# Patient Record
Sex: Female | Born: 1940
Health system: Southern US, Community
[De-identification: ages and names within clinical notes are randomized; demographics above are authoritative.]

## PROBLEM LIST (undated history)

## (undated) DIAGNOSIS — Z9889 Other specified postprocedural states: Secondary | ICD-10-CM

## (undated) DIAGNOSIS — F0390 Unspecified dementia without behavioral disturbance: Secondary | ICD-10-CM

## (undated) DIAGNOSIS — I10 Essential (primary) hypertension: Secondary | ICD-10-CM

## (undated) DIAGNOSIS — L578 Other skin changes due to chronic exposure to nonionizing radiation: Secondary | ICD-10-CM

## (undated) DIAGNOSIS — N289 Disorder of kidney and ureter, unspecified: Secondary | ICD-10-CM

## (undated) DIAGNOSIS — I341 Nonrheumatic mitral (valve) prolapse: Secondary | ICD-10-CM

## (undated) DIAGNOSIS — E8881 Metabolic syndrome: Secondary | ICD-10-CM

## (undated) DIAGNOSIS — N943 Premenstrual tension syndrome: Secondary | ICD-10-CM

## (undated) HISTORY — DX: Metabolic syndrome: E88.810

## (undated) HISTORY — DX: Essential (primary) hypertension: I10

## (undated) HISTORY — DX: Nonrheumatic mitral (valve) prolapse: I34.1

## (undated) HISTORY — DX: Other skin changes due to chronic exposure to nonionizing radiation: L57.8

## (undated) HISTORY — PX: BRAIN SURGERY: SHX531

## (undated) HISTORY — DX: Metabolic syndrome: E88.81

## (undated) HISTORY — DX: Premenstrual tension syndrome: N94.3

---

## 1999-10-16 ENCOUNTER — Encounter (INDEPENDENT_AMBULATORY_CARE_PROVIDER_SITE_OTHER): Payer: Self-pay | Admitting: Specialist

## 1999-10-16 ENCOUNTER — Ambulatory Visit (HOSPITAL_COMMUNITY): Admission: RE | Admit: 1999-10-16 | Discharge: 1999-10-16 | Payer: Self-pay | Admitting: Internal Medicine

## 2000-08-20 ENCOUNTER — Other Ambulatory Visit: Admission: RE | Admit: 2000-08-20 | Discharge: 2000-08-20 | Payer: Self-pay | Admitting: Family Medicine

## 2001-12-09 LAB — HM COLONOSCOPY

## 2002-03-13 ENCOUNTER — Emergency Department (HOSPITAL_COMMUNITY): Admission: EM | Admit: 2002-03-13 | Discharge: 2002-03-13 | Payer: Self-pay

## 2002-04-21 ENCOUNTER — Other Ambulatory Visit: Admission: RE | Admit: 2002-04-21 | Discharge: 2002-04-21 | Payer: Self-pay | Admitting: Family Medicine

## 2003-09-14 ENCOUNTER — Other Ambulatory Visit: Admission: RE | Admit: 2003-09-14 | Discharge: 2003-09-14 | Payer: Self-pay | Admitting: Family Medicine

## 2003-10-31 ENCOUNTER — Encounter: Admission: RE | Admit: 2003-10-31 | Discharge: 2003-10-31 | Payer: Self-pay | Admitting: Family Medicine

## 2004-10-03 ENCOUNTER — Other Ambulatory Visit: Admission: RE | Admit: 2004-10-03 | Discharge: 2004-10-03 | Payer: Self-pay | Admitting: Family Medicine

## 2004-12-17 ENCOUNTER — Ambulatory Visit: Payer: Self-pay | Admitting: Family Medicine

## 2005-02-22 ENCOUNTER — Ambulatory Visit: Payer: Self-pay | Admitting: Family Medicine

## 2005-11-13 ENCOUNTER — Ambulatory Visit: Payer: Self-pay | Admitting: Family Medicine

## 2005-11-26 ENCOUNTER — Other Ambulatory Visit: Admission: RE | Admit: 2005-11-26 | Discharge: 2005-11-26 | Payer: Self-pay | Admitting: Family Medicine

## 2005-11-26 ENCOUNTER — Ambulatory Visit: Payer: Self-pay | Admitting: Family Medicine

## 2005-12-09 ENCOUNTER — Encounter: Payer: Self-pay | Admitting: Family Medicine

## 2005-12-09 LAB — CONVERTED CEMR LAB

## 2006-08-25 ENCOUNTER — Ambulatory Visit: Payer: Self-pay | Admitting: Cardiology

## 2006-09-05 ENCOUNTER — Ambulatory Visit: Payer: Self-pay

## 2006-09-05 ENCOUNTER — Encounter: Payer: Self-pay | Admitting: Cardiology

## 2006-10-03 ENCOUNTER — Ambulatory Visit: Payer: Self-pay | Admitting: Family Medicine

## 2006-10-10 ENCOUNTER — Ambulatory Visit: Payer: Self-pay | Admitting: Family Medicine

## 2006-10-10 ENCOUNTER — Other Ambulatory Visit: Admission: RE | Admit: 2006-10-10 | Discharge: 2006-10-10 | Payer: Self-pay | Admitting: Family Medicine

## 2006-10-10 ENCOUNTER — Encounter: Payer: Self-pay | Admitting: Family Medicine

## 2007-01-23 ENCOUNTER — Encounter: Admission: RE | Admit: 2007-01-23 | Discharge: 2007-01-23 | Payer: Self-pay | Admitting: Family Medicine

## 2007-03-30 ENCOUNTER — Ambulatory Visit: Payer: Self-pay | Admitting: Family Medicine

## 2007-07-23 ENCOUNTER — Telehealth: Payer: Self-pay | Admitting: Family Medicine

## 2007-08-07 ENCOUNTER — Encounter: Payer: Self-pay | Admitting: Family Medicine

## 2007-08-07 DIAGNOSIS — E8881 Metabolic syndrome: Secondary | ICD-10-CM | POA: Insufficient documentation

## 2007-08-07 DIAGNOSIS — L568 Other specified acute skin changes due to ultraviolet radiation: Secondary | ICD-10-CM

## 2007-08-07 DIAGNOSIS — R32 Unspecified urinary incontinence: Secondary | ICD-10-CM

## 2007-08-07 DIAGNOSIS — N943 Premenstrual tension syndrome: Secondary | ICD-10-CM | POA: Insufficient documentation

## 2007-08-07 DIAGNOSIS — I10 Essential (primary) hypertension: Secondary | ICD-10-CM | POA: Insufficient documentation

## 2007-08-07 DIAGNOSIS — J309 Allergic rhinitis, unspecified: Secondary | ICD-10-CM | POA: Insufficient documentation

## 2007-08-07 DIAGNOSIS — I059 Rheumatic mitral valve disease, unspecified: Secondary | ICD-10-CM

## 2007-09-07 ENCOUNTER — Ambulatory Visit: Payer: Self-pay | Admitting: Family Medicine

## 2007-09-15 ENCOUNTER — Encounter: Payer: Self-pay | Admitting: Family Medicine

## 2007-09-15 ENCOUNTER — Other Ambulatory Visit: Admission: RE | Admit: 2007-09-15 | Discharge: 2007-09-15 | Payer: Self-pay | Admitting: Family Medicine

## 2007-09-15 ENCOUNTER — Ambulatory Visit: Payer: Self-pay | Admitting: Family Medicine

## 2008-01-27 ENCOUNTER — Encounter: Admission: RE | Admit: 2008-01-27 | Discharge: 2008-01-27 | Payer: Self-pay | Admitting: Family Medicine

## 2008-02-03 ENCOUNTER — Telehealth: Payer: Self-pay | Admitting: Family Medicine

## 2008-03-14 ENCOUNTER — Telehealth: Payer: Self-pay | Admitting: Family Medicine

## 2008-06-14 ENCOUNTER — Ambulatory Visit: Payer: Self-pay | Admitting: Family Medicine

## 2008-08-10 ENCOUNTER — Ambulatory Visit: Payer: Self-pay | Admitting: Family Medicine

## 2008-08-16 ENCOUNTER — Encounter: Payer: Self-pay | Admitting: Family Medicine

## 2008-08-16 ENCOUNTER — Ambulatory Visit: Payer: Self-pay | Admitting: Family Medicine

## 2008-08-16 ENCOUNTER — Other Ambulatory Visit: Admission: RE | Admit: 2008-08-16 | Discharge: 2008-08-16 | Payer: Self-pay | Admitting: Family Medicine

## 2008-08-16 DIAGNOSIS — H906 Mixed conductive and sensorineural hearing loss, bilateral: Secondary | ICD-10-CM

## 2008-09-20 ENCOUNTER — Ambulatory Visit: Payer: Self-pay | Admitting: Family Medicine

## 2009-02-16 DIAGNOSIS — F29 Unspecified psychosis not due to a substance or known physiological condition: Secondary | ICD-10-CM | POA: Insufficient documentation

## 2009-02-21 ENCOUNTER — Ambulatory Visit: Payer: Self-pay | Admitting: Family Medicine

## 2009-02-21 LAB — CONVERTED CEMR LAB
ALT: 18 units/L (ref 0–35)
AST: 29 units/L (ref 0–37)
Albumin: 4.3 g/dL (ref 3.5–5.2)
Alkaline Phosphatase: 70 units/L (ref 39–117)
BUN: 17 mg/dL (ref 6–23)
Basophils Relative: 0.7 % (ref 0.0–3.0)
Bilirubin, Direct: 0.2 mg/dL (ref 0.0–0.3)
CO2: 32 meq/L (ref 19–32)
Calcium: 10 mg/dL (ref 8.4–10.5)
Chloride: 102 meq/L (ref 96–112)
Cholesterol: 283 mg/dL — ABNORMAL HIGH (ref 0–200)
Creatinine, Ser: 0.8 mg/dL (ref 0.4–1.2)
Direct LDL: 170.2 mg/dL
Eosinophils Relative: 1.9 % (ref 0.0–5.0)
Folate: >20 ng/mL
GFR calc non Af Amer: 75.79 mL/min (ref >60)
Glucose, Bld: 88 mg/dL (ref 70–99)
HCT: 43.1 % (ref 36.0–46.0)
HDL: 85.6 mg/dL (ref >39.00)
Hemoglobin: 14.6 g/dL (ref 12.0–15.0)
Hgb A1c MFr Bld: 6.1 % (ref 4.6–6.5)
Iron: 105 ug/dL (ref 42–145)
Lymphocytes Relative: 23.2 % (ref 12.0–46.0)
MCHC: 33.9 g/dL (ref 30.0–36.0)
MCV: 88.9 fL (ref 78.0–100.0)
Monocytes Relative: 7.4 % (ref 3.0–12.0)
Neutrophils Relative %: 66.8 % (ref 43.0–77.0)
Platelets: 164 10*3/uL (ref 150.0–400.0)
Potassium: 3.6 meq/L (ref 3.5–5.1)
RBC: 4.85 M/uL (ref 3.87–5.11)
RDW: 13.7 % (ref 11.5–14.6)
Saturation Ratios: 24.1 % (ref 20.0–50.0)
Sodium: 143 meq/L (ref 135–145)
TSH: 3 microintl units/mL (ref 0.35–5.50)
Total Bilirubin: 0.8 mg/dL (ref 0.3–1.2)
Total CHOL/HDL Ratio: 3
Total Protein: 7.4 g/dL (ref 6.0–8.3)
Transferrin: 311.6 mg/dL (ref 212.0–360.0)
Triglycerides: 84 mg/dL (ref 0.0–149.0)
VLDL: 16.8 mg/dL (ref 0.0–40.0)
Vitamin B-12: 1012 pg/mL — ABNORMAL HIGH (ref 211–911)
WBC: 6.4 10*3/uL (ref 4.5–10.5)

## 2009-02-23 ENCOUNTER — Encounter: Admission: RE | Admit: 2009-02-23 | Discharge: 2009-02-23 | Payer: Self-pay | Admitting: Family Medicine

## 2009-02-24 ENCOUNTER — Ambulatory Visit: Payer: Self-pay | Admitting: Family Medicine

## 2009-03-28 ENCOUNTER — Encounter: Admission: RE | Admit: 2009-03-28 | Discharge: 2009-03-28 | Payer: Self-pay | Admitting: Family Medicine

## 2009-08-15 ENCOUNTER — Ambulatory Visit: Payer: Self-pay | Admitting: Family Medicine

## 2009-08-15 LAB — CONVERTED CEMR LAB
Bilirubin Urine: NEGATIVE
Blood in Urine, dipstick: NEGATIVE
Glucose, Urine, Semiquant: NEGATIVE
Ketones, urine, test strip: NEGATIVE
Nitrite: NEGATIVE
Protein, U semiquant: NEGATIVE
Specific Gravity, Urine: 1.025
Urobilinogen, UA: 0.2
WBC Urine, dipstick: NEGATIVE
pH: 5.5

## 2009-08-22 ENCOUNTER — Other Ambulatory Visit: Admission: RE | Admit: 2009-08-22 | Discharge: 2009-08-22 | Payer: Self-pay | Admitting: Family Medicine

## 2009-08-22 ENCOUNTER — Ambulatory Visit: Payer: Self-pay | Admitting: Family Medicine

## 2009-08-22 ENCOUNTER — Encounter: Payer: Self-pay | Admitting: Family Medicine

## 2009-08-22 DIAGNOSIS — G909 Disorder of the autonomic nervous system, unspecified: Secondary | ICD-10-CM | POA: Insufficient documentation

## 2009-08-31 ENCOUNTER — Ambulatory Visit: Payer: Self-pay | Admitting: Family Medicine

## 2009-08-31 ENCOUNTER — Encounter: Payer: Self-pay | Admitting: Family Medicine

## 2009-08-31 DIAGNOSIS — L570 Actinic keratosis: Secondary | ICD-10-CM | POA: Insufficient documentation

## 2009-09-04 ENCOUNTER — Encounter: Payer: Self-pay | Admitting: Family Medicine

## 2009-09-13 ENCOUNTER — Telehealth: Payer: Self-pay | Admitting: Family Medicine

## 2010-03-30 ENCOUNTER — Encounter: Admission: RE | Admit: 2010-03-30 | Discharge: 2010-03-30 | Payer: Self-pay | Admitting: Family Medicine

## 2010-03-30 LAB — HM MAMMOGRAPHY

## 2010-08-20 ENCOUNTER — Ambulatory Visit: Payer: Self-pay | Admitting: Family Medicine

## 2010-08-20 LAB — CONVERTED CEMR LAB
Bilirubin Urine: NEGATIVE
Blood in Urine, dipstick: NEGATIVE
Glucose, Urine, Semiquant: NEGATIVE
Ketones, urine, test strip: NEGATIVE
Nitrite: NEGATIVE
Protein, U semiquant: NEGATIVE
Specific Gravity, Urine: 1.02
Urobilinogen, UA: 0.2
WBC Urine, dipstick: NEGATIVE
pH: 5.5

## 2010-08-27 ENCOUNTER — Ambulatory Visit: Payer: Self-pay | Admitting: Family Medicine

## 2010-08-27 ENCOUNTER — Other Ambulatory Visit: Admission: RE | Admit: 2010-08-27 | Discharge: 2010-08-27 | Payer: Self-pay | Admitting: Family Medicine

## 2010-08-27 LAB — HM PAP SMEAR

## 2010-08-27 LAB — CONVERTED CEMR LAB: Pap Smear: NEGATIVE

## 2010-12-12 ENCOUNTER — Encounter: Payer: Self-pay | Admitting: Family Medicine

## 2011-01-08 NOTE — Assessment & Plan Note (Signed)
Summary: cpx/njr   Vital Signs:  Patient profile:   70 year old female Menstrual status:  postmenopausal Height:      66 inches Weight:      183 pounds BMI:     29.64 Temp:     98.6 degrees F oral BP sitting:   124 / 80  (left arm) Cuff size:   regular  Vitals Entered By: Kern Reap CMA Duncan Dull) (August 27, 2010 10:47 AM) CC: annual wellness exam Is Patient Diabetic? No Pain Assessment Patient in pain? yes        CC:  annual wellness exam.  History of Present Illness: Whitney Ray is a 70 year old, married female, nonsmoker, who comes in today for evaluation of hypertension, postmenopausal vaginal dryness, and general physical examination  Her blood pressure is treated with or thiazide 25 mg daily.  BP 124/80.  For the vaginal dryness.  We been using Premarin vaginal cream twice weekly, however, she read an article that since Premarin vaginal cream can cause cancer.  Her other concern is that we gave her some Elavil for the peripheral neuropathy, etiology unknown.  She states the Elavil cause some short-term memory loss.  She stopped the Elavil and feels like her memory is back to normal.  We offered neurologic consultation if this does not completely resolve.  She gets routine eye care, dental care, does BSE monthly, annual mammography, colonoscopy, normal, and it is 2006, seasonal flu shot 2010 and today, Pneumovax 2007, shingles 2008.  Allergies: 1)  Penicillin  Past History:  Past medical, surgical, family and social histories (including risk factors) reviewed, and no changes noted (except as noted below).  Past Medical History: Reviewed history from 08/07/2007 and no changes required. Allergic rhinitis Hypertension Urinary incontinence PMS mitral valve prolapse chronic sun damage metabolic syndrome--elevated BS, elevated cholesterol, obese  Past Surgical History: Reviewed history from 08/07/2007 and no changes required. CB X3  Family History: Reviewed  history and no changes required.  Social History: Reviewed history from 09/15/2007 and no changes required. Married Never Smoked Alcohol use-no Drug use-no Regular exercise-yes  Review of Systems      See HPI  Physical Exam  General:  Well-developed,well-nourished,in no acute distress; alert,appropriate and cooperative throughout examination Head:  Normocephalic and atraumatic without obvious abnormalities. No apparent alopecia or balding. Eyes:  No corneal or conjunctival inflammation noted. EOMI. Perrla. Funduscopic exam benign, without hemorrhages, exudates or papilledema. Vision grossly normal. Ears:  External ear exam shows no significant lesions or deformities.  Otoscopic examination reveals clear canals, tympanic membranes are intact bilaterally without bulging, retraction, inflammation or discharge. Hearing is grossly normal bilaterally. Nose:  External nasal examination shows no deformity or inflammation. Nasal mucosa are pink and moist without lesions or exudates. Mouth:  Oral mucosa and oropharynx without lesions or exudates.  Teeth in good repair. Neck:  No deformities, masses, or tenderness noted. Chest Wall:  No deformities, masses, or tenderness noted. Breasts:  No mass, nodules, thickening, tenderness, bulging, retraction, inflamation, nipple discharge or skin changes noted.   Lungs:  Normal respiratory effort, chest expands symmetrically. Lungs are clear to auscultation, no crackles or wheezes. Heart:  Normal rate and regular rhythm. S1 and S2 normal without gallop, murmur, click, rub or other extra sounds. Abdomen:  Bowel sounds positive,abdomen soft and non-tender without masses, organomegaly or hernias noted. Rectal:  No external abnormalities noted. Normal sphincter tone. No rectal masses or tenderness. Genitalia:  Pelvic Exam:        External: normal female genitalia without  lesions or masses        Vagina: normal without lesions or masses        Cervix: normal  without lesions or masses        Adnexa: normal bimanual exam without masses or fullness        Uterus: normal by palpation        Pap smear: performed Msk:  No deformity or scoliosis noted of thoracic or lumbar spine.   Pulses:  R and L carotid,radial,femoral,dorsalis pedis and posterior tibial pulses are full and equal bilaterally Extremities:  No clubbing, cyanosis, edema, or deformity noted with normal full range of motion of all joints.   Neurologic:  No cranial nerve deficits noted. Station and gait are normal. Plantar reflexes are down-going bilaterally. DTRs are symmetrical throughout. Sensory, motor and coordinative functions appear intact. Skin:  total body skin exam normal except for a red lesion on her right lower medial ankle.  She states been present for about 3 weeks is nonpruritic.  She's had a history with eczema in the past.  Advised to use OTC cortisone cream.  If over the next two to 3 weeks.  The lesion does not resolve come in for removal Cervical Nodes:  No lymphadenopathy noted Axillary Nodes:  No palpable lymphadenopathy Inguinal Nodes:  No significant adenopathy Psych:  Cognition and judgment appear intact. Alert and cooperative with normal attention span and concentration. No apparent delusions, illusions, hallucinations   Impression & Recommendations:  Problem # 1:  PREVENTIVE HEALTH CARE (ICD-V70.0) Assessment Unchanged  Orders: TLB-BMP (Basic Metabolic Panel-BMET) (80048-METABOL) TLB-CBC Platelet - w/Differential (85025-CBCD) TLB-Hepatic/Liver Function Pnl (80076-HEPATIC) TLB-TSH (Thyroid Stimulating Hormone) (84443-TSH) TLB-B12 + Folate Pnl (60454_09811-B14/NWG) TLB-IBC Pnl (Iron/FE;Transferrin) (83550-IBC) TLB-A1C / Hgb A1C (Glycohemoglobin) (83036-A1C)  Problem # 2:  HYPERTENSION (ICD-401.9) Assessment: Improved  Her updated medication list for this problem includes:    Hydrochlorothiazide 25 Mg Tabs (Hydrochlorothiazide) .Marland Kitchen... Take 1 every  morning  Orders: TLB-BMP (Basic Metabolic Panel-BMET) (80048-METABOL) TLB-CBC Platelet - w/Differential (85025-CBCD) TLB-Hepatic/Liver Function Pnl (80076-HEPATIC) TLB-TSH (Thyroid Stimulating Hormone) (84443-TSH) TLB-B12 + Folate Pnl (95621_30865-H84/ONG) TLB-IBC Pnl (Iron/FE;Transferrin) (83550-IBC) TLB-A1C / Hgb A1C (Glycohemoglobin) (83036-A1C)  Complete Medication List: 1)  Aspirin 81 Mg Tbec (Aspirin) .... Once daily 2)  Multivitamins Caps (Multiple vitamin) .... Once daily 3)  Hydrochlorothiazide 25 Mg Tabs (Hydrochlorothiazide) .... Take 1 every morning 4)  Cvs Iron 325 (65 Fe) Mg Tabs (Ferrous sulfate) .... 3x per week 5)  Premarin 0.625 Mg/gm Crea (Estrogens, conjugated) .... Apply 2 x week 6)  Fish Oil Oil (Fish oil) .... Once daily 7)  Magnesium Oxide 250 Mg Tabs (Magnesium oxide) .... Take one tab two times a day  Other Orders: Venipuncture (29528) TLB-Lipid Panel (80061-LIPID)  Patient Instructions: 1)  Please schedule a follow-up appointment in 1 year. 2)  It is important that you exercise regularly at least 20 minutes 5 times a week. If you develop chest pain, have severe difficulty breathing, or feel very tired , stop exercising immediately and seek medical attention. 3)  Schedule your mammogram. 4)  Schedule a colonoscopy/sigmoidoscopy to help detect colon cancer. 5)  Take calcium +Vitamin D daily. 6)  Take an Aspirin every day. Prescriptions: PREMARIN 0.625 MG/GM CREA (ESTROGENS, CONJUGATED) apply 2 x week  #3 tubes x 6   Entered and Authorized by:   Roderick Pee MD   Signed by:   Roderick Pee MD on 08/27/2010   Method used:   Print then Give to Patient   RxID:  [id number] HYDROCHLOROTHIAZIDE 25 MG  TABS (HYDROCHLOROTHIAZIDE) take 1 every morning  #100 x 3   Entered and Authorized by:   Roderick Pee MD   Signed by:   Roderick Pee MD on 08/27/2010   Method used:   Print then Give to Patient   RxID:   [id number]

## 2011-01-10 NOTE — Miscellaneous (Signed)
Summary: eye exam  Clinical Lists Changes       Eye Exam  cataracts in both eyes dry eyes arcus senitlis in both eyes bevis, 11-21-10

## 2011-02-25 ENCOUNTER — Other Ambulatory Visit: Payer: Self-pay | Admitting: Family Medicine

## 2011-02-25 DIAGNOSIS — Z1231 Encounter for screening mammogram for malignant neoplasm of breast: Secondary | ICD-10-CM

## 2011-04-08 ENCOUNTER — Ambulatory Visit
Admission: RE | Admit: 2011-04-08 | Discharge: 2011-04-08 | Disposition: A | Discharge status: Home / Self Care | Payer: BC Managed Care – PPO | Source: Ambulatory Visit | Attending: Family Medicine | Admitting: Family Medicine

## 2011-04-08 DIAGNOSIS — Z1231 Encounter for screening mammogram for malignant neoplasm of breast: Secondary | ICD-10-CM

## 2011-04-26 NOTE — Op Note (Signed)
[facility]  Patient:    Whitney Ray                        MRN: [id number] Proc. Date: 10/16/99 Adm. Date:  [id number] Attending:  Amanda Cockayne                           Operative Report  PREOPERATIVE DIAGNOSIS:  POSTOPERATIVE DIAGNOSIS:  OPERATION:                    Resection of fibroid and a large endometrial polyp.  SURGEON:                      Esmeralda Arthur, M.D.  ASSISTANT:  ANESTHESIA:                   General anesthesia.  PACKS:                        None.  MEDIUM:                       Sorbitol.  DEFICIT:                      160 cc total, 60 cc deficit before resection started.  FINDINGS:                     The uterus sounded to 10 cm.  She had a very large endometrial polyp and she had multiple fibroids.  These were resected.  Whether we resected the fibroids completely, I cannot be sure.  ESTIMATED BLOOD LOSS:  DESCRIPTION OF PROCEDURE:     The patient was carried to the operating room and  after the satisfactory general anesthesia, she was placed in the lithotomy position, prepped and draped and the bladder was drained.  Examination revealed the uterus to be midposterior more posterior.  The weighted speculum was placed in the posterior vagina.  The cervix was grasped with a tenaculum.  The uterus was sounded to 10 cm.  The cervix was easily dilated to a #23 and we put the observation scope in.  I could see this large mass in the uterus and other fibroids.  We then dilated her to a #33, put the resectoscope in and began resecting the polyp.  She had fibroids on the lateral wall and I think fibroids on the posterior wall.  The pressure was set at 60 and then after what I thought was resection, e progressively dropped it.  We used a single loupe on 110/110.  After we thought the bleeding was under control, we then stopped and observed bleeding for two minutes.  The deficit had been 330 and  dropped to 160 because he fluid drained.  She had no excessive bleeding over a period of two minutes and we discontinued he procedure.  The patient was carried to the recovery room in good condition. DD:  10/16/99 TD:  10/17/99 Job: [id number]

## 2011-04-26 NOTE — Assessment & Plan Note (Signed)
[facility]                              CARDIOLOGY OFFICE NOTE   Whitney Ray, Whitney Ray                    MRN:          [id number]  DATE:08/25/2006                            DOB:          October 15, 1941    The primary is Tinnie Gens A. Tawanna Cooler, MD.  The referral is self.   REASON FOR PRESENTATION:  Evaluate patient with palpitations.   HISTORY OF PRESENT ILLNESS:  The patient is a 70 year old white female with  a past history of palpitations.  She was seen in the emergency room in 2003.  It is not clear to me whether she had PVCs or supraventricular tachycardia.  She remembers getting medication to bring her heart rate down.  She got this  via EMS.  She had no further cardiac workup and has had no further  palpitations.  On Friday three days prior to this, she noticed a rapid heart  rate while in the yard.  She did not feel lightheaded, presyncopal or  syncopal with this.  She did not have any chest discomfort, neck or arm  discomfort.  She was not particularly short of breath and otherwise felt  fairly well.  She took her heart rate and noticed it to be about 120.  By  the time she got to an urgent care, it was back down into the 70s.  They did  report that they saw premature ventricular contractions on a rhythm strip or  monitor with one episode of trigeminy.  However, this was not recorded.  Her  EKG was sinus rhythm.  She was slightly hypertensive.  She was referred here  for follow-up.   Before this and since then she has had no further palpitations.  She is  otherwise active.  She does spinning.  She denies any chest discomfort, neck  discomfort or arm discomfort.  She has no significant shortness of breath.   PAST MEDICAL HISTORY:  Borderline diabetes, hypertension.   PAST SURGICAL HISTORY:  None.   ALLERGIES:  None.   MEDICATIONS:  Zyrtec 10 mg p.r.n., vitamin E, hydrochlorothiazide, aspirin,  multivitamin, magnesium, amoxicillin.   SOCIAL  HISTORY:  The patient quit smoking 30 years ago.  She is married.  She has 3 children.  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   FAMILY HISTORY:  Noncontributory for early coronary artery disease, sudden  cardiac death.   REVIEW OF SYSTEMS:  As stated in the HPI and negative for other systems.   PHYSICAL EXAMINATION:  GENERAL:  The patient is in no distress with blood  pressure 132/68, heart rate 65 and regular, weight 186 pounds.  Body mass  index 30.  HEENT:  Eyelids unremarkable.  Pupils equal, round and reactive to light.  Fundi within normal limits.  Oral mucosa unremarkable.  NECK:  No jugular venous distention.  Wave form within normal limits.  Carotid upstroke brisk and symmetrical, no bruits, no thyromegaly.  LYMPHATIC:  No cervical, axillary or inguinal adenopathy.  LUNGS:  Clear to auscultation bilaterally.  BACK:  No costovertebral angle tenderness.  CHEST:  Unremarkable.  CARDIAC:  PMI not  displaced or sustained, S1 and S2 within normal limits, no  S3, no S4, no murmurs.  ABDOMEN:  Obese, positive bowel sounds, normal in frequency and pitch.  No  bruits, no rebound, no guarding, no midline pulsatile mass, no hepatomegaly,  no splenomegaly.  SKIN:  No rashes, no nodules.  EXTREMITIES:  2+ pulses throughout, no edema, no cyanosis, no clubbing.  NEUROLOGIC:  Oriented to person, place and time.  Cranial nerves II-XII  grossly intact.  Motor grossly intact.   EKG:  Sinus rhythm, rate 65, axis within normal limits, intervals within  normal limits, poor anterior R wave progression, no acute ST wave changes.   ASSESSMENT AND PLAN:  1. Palpitations.  The patient had palpitations.  I am not sure of the      etiology.  She was noted to have PVCs with one triplet.  I am going to      get an echocardiogram to make sure she has left ventricular function      and structure.  She did have blood work, but I do not see a TSH,      potassium or magnesium.  She does not want to  really have blood work      drawn today and is going to have this done anyway when she sees Dr.      Tawanna Cooler in a few weeks.  I will defer follow-up of this to Dr. Tawanna Cooler but      again would suggest a magnesium, potassium and TSH as part of her      routine blood work.  If she has no further palpitations and normal      echocardiogram, then no further workup is warranted.  2. Obesity.  She understands the need to lose weight with diet and      exercise.  Her body mass index is 30, which puts her in the obese      range.  3. Follow-up will be as needed based on recurrent symptoms or the results      of the echocardiogram.                               Rollene Rotunda, MD, [facility]    JH/MedQ  DD:  08/25/2006  DT:  08/26/2006  Job #:  [id number]   cc:   Tinnie Gens A. Tawanna Cooler, MD

## 2011-09-10 ENCOUNTER — Encounter: Payer: Self-pay | Admitting: Family Medicine

## 2011-09-11 ENCOUNTER — Ambulatory Visit (INDEPENDENT_AMBULATORY_CARE_PROVIDER_SITE_OTHER): Payer: BC Managed Care – PPO | Admitting: Family Medicine

## 2011-09-11 ENCOUNTER — Encounter: Payer: Self-pay | Admitting: Family Medicine

## 2011-09-11 ENCOUNTER — Other Ambulatory Visit (HOSPITAL_COMMUNITY)
Admission: RE | Admit: 2011-09-11 | Discharge: 2011-09-11 | Disposition: A | Discharge status: Home / Self Care | Payer: Medicare Other | Source: Ambulatory Visit | Attending: Family Medicine | Admitting: Family Medicine

## 2011-09-11 DIAGNOSIS — Z23 Encounter for immunization: Secondary | ICD-10-CM

## 2011-09-11 DIAGNOSIS — H906 Mixed conductive and sensorineural hearing loss, bilateral: Secondary | ICD-10-CM

## 2011-09-11 DIAGNOSIS — I10 Essential (primary) hypertension: Secondary | ICD-10-CM

## 2011-09-11 DIAGNOSIS — L570 Actinic keratosis: Secondary | ICD-10-CM

## 2011-09-11 DIAGNOSIS — Z124 Encounter for screening for malignant neoplasm of cervix: Secondary | ICD-10-CM | POA: Insufficient documentation

## 2011-09-11 DIAGNOSIS — Z0000 Encounter for general adult medical examination without abnormal findings: Secondary | ICD-10-CM

## 2011-09-11 DIAGNOSIS — R32 Unspecified urinary incontinence: Secondary | ICD-10-CM

## 2011-09-11 LAB — CBC WITH DIFFERENTIAL/PLATELET
Basophils Absolute: 0 10*3/uL (ref 0.0–0.1)
Eosinophils Absolute: 0.1 10*3/uL (ref 0.0–0.7)
HCT: 42.8 % (ref 36.0–46.0)
Lymphs Abs: 1.2 10*3/uL (ref 0.7–4.0)
MCHC: 33.1 g/dL (ref 30.0–36.0)
MCV: 89.4 fl (ref 78.0–100.0)
Monocytes Absolute: 0.4 10*3/uL (ref 0.1–1.0)
Platelets: 171 10*3/uL (ref 150.0–400.0)
RDW: 14.8 % — ABNORMAL HIGH (ref 11.5–14.6)

## 2011-09-11 LAB — HEPATIC FUNCTION PANEL
Albumin: 4.2 g/dL (ref 3.5–5.2)
Total Bilirubin: 0.5 mg/dL (ref 0.3–1.2)

## 2011-09-11 LAB — POCT URINALYSIS DIPSTICK
Bilirubin, UA: NEGATIVE
Ketones, UA: NEGATIVE
Nitrite, UA: NEGATIVE

## 2011-09-11 LAB — BASIC METABOLIC PANEL
BUN: 15 mg/dL (ref 6–23)
Chloride: 105 mEq/L (ref 96–112)
GFR: 66.52 mL/min (ref >60.00)
Glucose, Bld: 95 mg/dL (ref 70–99)
Potassium: 4.4 mEq/L (ref 3.5–5.1)
Sodium: 145 mEq/L (ref 135–145)

## 2011-09-11 LAB — LIPID PANEL
HDL: 81.6 mg/dL (ref >39.00)
Triglycerides: 111 mg/dL (ref 0.0–149.0)

## 2011-09-11 LAB — TSH: TSH: 2.56 u[IU]/mL (ref 0.35–5.50)

## 2011-09-11 LAB — LDL CHOLESTEROL, DIRECT: Direct LDL: 177.6 mg/dL

## 2011-09-11 MED ORDER — ESTROGENS, CONJUGATED 0.625 MG/GM VA CREA: TOPICAL_CREAM | Freq: Every day | VAGINAL | Status: DC

## 2011-09-11 MED ORDER — HYDROCHLOROTHIAZIDE 25 MG PO TABS: 25.0000 mg | ORAL_TABLET | Freq: Every day | ORAL | Status: DC

## 2011-09-11 NOTE — Progress Notes (Signed)
  Subjective:    Patient ID: Whitney Ray, female    DOB: 02/12/1941, 70 y.o.   MRN: [id number]  HPI Whitney Ray is a delightful, 69 year old, married female, nonsmoker, who comes in today for Medicare wellness examination because of a history of hypertension hearing loss, sore dermatitis, and urinary incontinence.  She takes Mevacor, thiazide 25 mg daily for hypertension, BP 120/80 at home.  She uses Premarin vaginal cream twice weekly for vaginal dryness and to help because she has a slightly prolapsed bladder.  She gets routine eye care, hearing diminished referred for an audiogram, rigor, dental care, BSE monthly, annual mammography, colonoscopy, normal, tetanus, and shingles.  Vaccine up-to-date flu shot and Pneumovax today, cognitive function, normal, activities of daily living.  She walks on a regular basis.  No guns in the house, she does not have a healthcare power of attorney.  No living will.  She was referred to an attorney to get that done.  Home health safety reviewed.  No issues identified.   Review of Systems  Constitutional: Negative.   HENT: Negative.   Eyes: Negative.   Respiratory: Negative.   Cardiovascular: Negative.   Gastrointestinal: Negative.   Genitourinary: Negative.   Musculoskeletal: Negative.   Neurological: Negative.   Hematological: Negative.   Psychiatric/Behavioral: Negative.        Objective:   Physical Exam  Constitutional: She appears well-developed and well-nourished.  HENT:  Head: Normocephalic and atraumatic.  Right Ear: External ear normal.  Left Ear: External ear normal.  Nose: Nose normal.  Mouth/Throat: Oropharynx is clear and moist.  Eyes: EOM are normal. Pupils are equal, round, and reactive to light.  Neck: Normal range of motion. Neck supple. No thyromegaly present.  Cardiovascular: Normal rate, regular rhythm, normal heart sounds and intact distal pulses.  Exam reveals no gallop and no friction rub.   No murmur  heard. Pulmonary/Chest: Effort normal and breath sounds normal.  Abdominal: Soft. Bowel sounds are normal. She exhibits no distension and no mass. There is no tenderness. There is no rebound.  Genitourinary: Vagina normal and uterus normal. Guaiac negative stool. No vaginal discharge found.  Musculoskeletal: Normal range of motion.  Lymphadenopathy:    She has no cervical adenopathy.  Neurological: She is alert. She has normal reflexes. No cranial nerve deficit. She exhibits normal muscle tone. Coordination normal.  Skin: Skin is warm and dry.       Total body skin exam shows two lesions, one on her right lower extremity.  The other left lower extremity, red, irritated, not healing.  Advised to return for removal  Psychiatric: She has a normal mood and affect. Her behavior is normal. Judgment and thought content normal.          Assessment & Plan:  Healthy female.  Hypertension.  Continue hydrochlorothiazide 25 mg daily.  Vaginal dryness with urinary incontinence.  Recommend Premarin vaginal cream twice weekly.  History of solar dermatitis remember to wear sunscreens.  Two read nonhealing lesions.  Lower extremities.  Advised to return for removal

## 2011-09-11 NOTE — Patient Instructions (Signed)
Continue the hydrochlorothiazide one tablet daily.  Use small amounts of the Premarin vaginal cream twice weekly.  Remember to wear your sunscreens SPF 50.  Return sometime in the next couple weeks for removal of the two lesions we discussed.  The attorney.  I recommended was Maxwell Caul........ For your healthcare power of attorney and living will.  Also recommend Dr. Gweneth Dimitri  for eye exam.  If there is any issues.  Return in one year or sooner if any problem

## 2011-09-15 LAB — VITAMIN D 1,25 DIHYDROXY: Vitamin D3 1, 25 (OH)2: 45 pg/mL

## 2011-09-16 NOTE — Progress Notes (Signed)
Quick Note:  Pt aware ______ 

## 2011-09-26 ENCOUNTER — Encounter: Payer: Self-pay | Admitting: Family Medicine

## 2011-09-26 ENCOUNTER — Ambulatory Visit (INDEPENDENT_AMBULATORY_CARE_PROVIDER_SITE_OTHER): Payer: Medicare Other | Admitting: Family Medicine

## 2011-09-26 DIAGNOSIS — D239 Other benign neoplasm of skin, unspecified: Secondary | ICD-10-CM

## 2011-09-26 DIAGNOSIS — I10 Essential (primary) hypertension: Secondary | ICD-10-CM

## 2011-09-26 NOTE — Progress Notes (Signed)
  Subjective:    Patient ID: Whitney Ray, female    DOB: [birth year], 70 y.o.   MRN: [id number]  HPI Whitney Ray is a 70 year old female, married, nonsmoker, who comes in today for removal of two lesions.  Lesion number one is an 8-mm times 8-mm red lesion right lower extremity.  Lesion number two is an 8 mm x 8 mm red, raised irritated lesion left lower extremity.  After informed consent, she was taken to the treatment room.  The lesions were cleaned with alcohol and anesthetized with 1% Xylocaine with epinephrine.  They were excised with 3-mm margins.  The bases were cauterized.  Band-Aids were applied.  The lesions were sent for pathologic analysis.  Clinical diagnosis dysplastic nevi....... Path pending   Review of Systems    General and dermatologic review of systems otherwise negative Objective:   Physical Exam Procedure see above       Assessment & Plan:  Clinically dysplastic nevi x 2 removed

## 2011-09-26 NOTE — Progress Notes (Signed)
Addended by: Kern Reap B on: 09/26/2011 01:14 PM   Modules accepted: Orders

## 2011-09-26 NOTE — Patient Instructions (Signed)
Remove the Band-Aid tomorrow.  If we do not call you within two weeks with her path report.  Call me

## 2011-10-07 ENCOUNTER — Telehealth: Payer: Self-pay | Admitting: *Deleted

## 2011-10-07 NOTE — Telephone Encounter (Signed)
Pt states the lesion that Dr. Tawanna Cooler excised is infected.  Could not come today so scheduled a work in visit tomorrow with Dr. Tawanna Cooler.

## 2011-10-07 NOTE — Telephone Encounter (Signed)
Whitney Ray please call//////////// warm soaks antibiotic ointment and a Band-Aid and come in tomorrow for evaluation

## 2011-10-07 NOTE — Telephone Encounter (Signed)
Left message for patient

## 2011-10-08 ENCOUNTER — Ambulatory Visit: Payer: Medicare Other | Admitting: Family Medicine

## 2012-03-16 ENCOUNTER — Other Ambulatory Visit: Payer: Self-pay | Admitting: Dermatology

## 2012-03-17 ENCOUNTER — Other Ambulatory Visit: Payer: Self-pay | Admitting: Family Medicine

## 2012-03-17 DIAGNOSIS — Z1231 Encounter for screening mammogram for malignant neoplasm of breast: Secondary | ICD-10-CM

## 2012-04-10 ENCOUNTER — Ambulatory Visit
Admission: RE | Admit: 2012-04-10 | Discharge: 2012-04-10 | Disposition: A | Discharge status: Home / Self Care | Payer: Medicare Other | Source: Ambulatory Visit | Attending: Family Medicine | Admitting: Family Medicine

## 2012-04-10 DIAGNOSIS — Z1231 Encounter for screening mammogram for malignant neoplasm of breast: Secondary | ICD-10-CM

## 2012-04-16 ENCOUNTER — Other Ambulatory Visit: Payer: Self-pay | Admitting: Dermatology

## 2012-05-27 ENCOUNTER — Telehealth: Payer: Self-pay | Admitting: Family Medicine

## 2012-05-27 NOTE — Telephone Encounter (Signed)
Pt is feeling sluggish and feels that she should be taking b12 and would like to be contacted to talk about this

## 2012-05-28 NOTE — Telephone Encounter (Signed)
Whitney Ray she can come in for a CBC and a B12 level nonfasting

## 2012-05-28 NOTE — Telephone Encounter (Signed)
Left message with husband.

## 2012-09-14 ENCOUNTER — Encounter: Payer: Self-pay | Admitting: Family Medicine

## 2012-09-14 ENCOUNTER — Ambulatory Visit (INDEPENDENT_AMBULATORY_CARE_PROVIDER_SITE_OTHER): Payer: Medicare Other | Admitting: Family Medicine

## 2012-09-14 VITALS — BP 116/68 | HR 73 | Temp 98.4°F | Resp 19 | Ht 66.0 in | Wt 181.0 lb

## 2012-09-14 DIAGNOSIS — H906 Mixed conductive and sensorineural hearing loss, bilateral: Secondary | ICD-10-CM

## 2012-09-14 DIAGNOSIS — J309 Allergic rhinitis, unspecified: Secondary | ICD-10-CM

## 2012-09-14 DIAGNOSIS — I059 Rheumatic mitral valve disease, unspecified: Secondary | ICD-10-CM

## 2012-09-14 DIAGNOSIS — Z23 Encounter for immunization: Secondary | ICD-10-CM

## 2012-09-14 DIAGNOSIS — E8881 Metabolic syndrome: Secondary | ICD-10-CM

## 2012-09-14 DIAGNOSIS — D649 Anemia, unspecified: Secondary | ICD-10-CM

## 2012-09-14 DIAGNOSIS — I10 Essential (primary) hypertension: Secondary | ICD-10-CM

## 2012-09-14 DIAGNOSIS — R5381 Other malaise: Secondary | ICD-10-CM

## 2012-09-14 DIAGNOSIS — D239 Other benign neoplasm of skin, unspecified: Secondary | ICD-10-CM

## 2012-09-14 DIAGNOSIS — Z0000 Encounter for general adult medical examination without abnormal findings: Secondary | ICD-10-CM

## 2012-09-14 DIAGNOSIS — R32 Unspecified urinary incontinence: Secondary | ICD-10-CM

## 2012-09-14 DIAGNOSIS — R5383 Other fatigue: Secondary | ICD-10-CM

## 2012-09-14 DIAGNOSIS — L568 Other specified acute skin changes due to ultraviolet radiation: Secondary | ICD-10-CM

## 2012-09-14 LAB — IBC PANEL
Iron: 68 ug/dL (ref 42–145)
Saturation Ratios: 21 % (ref 20.0–50.0)
Transferrin: 230.9 mg/dL (ref 212.0–360.0)

## 2012-09-14 LAB — POCT URINALYSIS DIPSTICK
Bilirubin, UA: NEGATIVE
Leukocytes, UA: NEGATIVE
Nitrite, UA: NEGATIVE
Protein, UA: NEGATIVE
Urobilinogen, UA: 0.2
pH, UA: 6

## 2012-09-14 LAB — CBC WITH DIFFERENTIAL/PLATELET
Basophils Absolute: 0 10*3/uL (ref 0.0–0.1)
Eosinophils Absolute: 0.2 10*3/uL (ref 0.0–0.7)
HCT: 42.8 % (ref 36.0–46.0)
Lymphocytes Relative: 24.9 % (ref 12.0–46.0)
Lymphs Abs: 1.3 10*3/uL (ref 0.7–4.0)
MCHC: 32.6 g/dL (ref 30.0–36.0)
Monocytes Relative: 8.1 % (ref 3.0–12.0)
Platelets: 168 10*3/uL (ref 150.0–400.0)
RDW: 15.4 % — ABNORMAL HIGH (ref 11.5–14.6)

## 2012-09-14 LAB — BASIC METABOLIC PANEL
Chloride: 103 mEq/L (ref 96–112)
Potassium: 3.7 mEq/L (ref 3.5–5.1)
Sodium: 139 mEq/L (ref 135–145)

## 2012-09-14 LAB — TSH: TSH: 3.19 u[IU]/mL (ref 0.35–5.50)

## 2012-09-14 MED ORDER — ESTROGENS, CONJUGATED 0.625 MG/GM VA CREA: TOPICAL_CREAM | Freq: Every day | VAGINAL | Status: DC

## 2012-09-14 MED ORDER — HYDROCHLOROTHIAZIDE 25 MG PO TABS: 25.0000 mg | ORAL_TABLET | Freq: Every day | ORAL | Status: DC

## 2012-09-14 NOTE — Progress Notes (Signed)
  Subjective:    Patient ID: Whitney Ray, female    DOB: [birth year], 71 y.o.   MRN: [id number]  HPI Whitney Ray is a 22 -year-old married female nonsmoker who comes in today for a Medicare wellness examination because of a history of postmenopausal vaginal dryness, mild hypertension fluid retention,  She uses Premarin vaginal cream twice weekly and hydrochlorothiazide 25 mg daily for blood pressure control BP 116/68  She gets routine eye care, dental care, BSE monthly, and you mammography, colonoscopy and GI, tetanus 2006, shingles 2009, Pneumovax 2012.  Cognitive function normal she walks on a daily basis home health safety reviewed no issues identified, no guns in the house, she does have a health care power of attorney and living well   Review of Systems  Constitutional: Negative.   HENT: Negative.   Eyes: Negative.   Respiratory: Negative.   Cardiovascular: Negative.   Gastrointestinal: Negative.   Genitourinary: Negative.   Musculoskeletal: Negative.   Neurological: Negative.   Hematological: Negative.   Psychiatric/Behavioral: Negative.        Objective:   Physical Exam  Constitutional: She appears well-developed and well-nourished.  HENT:  Head: Normocephalic and atraumatic.  Right Ear: External ear normal.  Left Ear: External ear normal.  Nose: Nose normal.  Mouth/Throat: Oropharynx is clear and moist.  Eyes: EOM are normal. Pupils are equal, round, and reactive to light.  Neck: Normal range of motion. Neck supple. No thyromegaly present.  Cardiovascular: Normal rate, regular rhythm, normal heart sounds and intact distal pulses.  Exam reveals no gallop and no friction rub.   No murmur heard. Pulmonary/Chest: Effort normal and breath sounds normal.  Abdominal: Soft. Bowel sounds are normal. She exhibits no distension and no mass. There is no tenderness. There is no rebound.  Genitourinary: Vagina normal and uterus normal. Guaiac negative stool. No vaginal discharge  found.       Bilateral breast exam normal  Musculoskeletal: Normal range of motion.  Lymphadenopathy:    She has no cervical adenopathy.  Neurological: She is alert. She has normal reflexes. No cranial nerve deficit. She exhibits normal muscle tone. Coordination normal.  Skin: Skin is warm and dry.  Psychiatric: She has a normal mood and affect. Her behavior is normal. Judgment and thought content normal.          Assessment & Plan:  Healthy female  Mild hypertension continue her chlorothiazide check labs  Postmenopausal vaginal dryness use Premarin vaginal cream twice weekly  Return in one year sooner if any problems  Do a thorough skin and breast exam monthly

## 2012-09-14 NOTE — Patient Instructions (Signed)
Uterus small amounts of the hormonal cream twice weekly  Continue the hydrochlorothiazide 25 mg daily  Remember to go for a 30 minute walk daily  Also a thorough breasting skin exam monthly  Return in one year sooner if any problems

## 2012-10-01 ENCOUNTER — Telehealth: Payer: Self-pay | Admitting: Family Medicine

## 2012-10-01 NOTE — Telephone Encounter (Signed)
Caller: Unita/Patient; Patient Name: Whitney Ray; PCP: Kelle Darting [facility]); Best Callback Phone Number: [phone].  Pt. inquiring about recent labwork on 09/14/12.  There are very minor abnormals, therefore I am not able to give out the results.  She would like a copy sent to her home.  Thank you.  CAN/db.

## 2012-10-05 NOTE — Telephone Encounter (Signed)
Left message for patient to return our call with lab result

## 2012-10-05 NOTE — Telephone Encounter (Signed)
Rachel please call labs normal 

## 2012-10-07 ENCOUNTER — Telehealth: Payer: Self-pay | Admitting: Family Medicine

## 2012-10-07 NOTE — Telephone Encounter (Signed)
Caller: Shaniece/Patient; Patient Name: Whitney Ray; PCP: Kelle Darting [facility]); Best Callback Phone Number: [phone].  Patient calling about lab results.  Seen in office 3 weeks ago, and had bloodwork, and was told he would call with results.  Called back 10/05/12 to request results again, and states she missed a call back from the staff.  States has also requested copy of labwork to be mailed to her home, and still has not received it.  Per Epic, Dr. Tawanna Cooler wanted patient to know that her labwork was normal.  Patient advised; would like copy sent to her home.  Address verified in Epic.  Info to office for staff review/follow up.

## 2012-12-29 ENCOUNTER — Ambulatory Visit: Payer: BC Managed Care – PPO | Admitting: Family Medicine

## 2013-03-17 ENCOUNTER — Ambulatory Visit: Payer: Medicare Other | Admitting: Family Medicine

## 2013-03-30 ENCOUNTER — Other Ambulatory Visit: Payer: Self-pay

## 2013-03-30 DIAGNOSIS — Z1231 Encounter for screening mammogram for malignant neoplasm of breast: Secondary | ICD-10-CM

## 2013-04-30 ENCOUNTER — Ambulatory Visit
Admission: RE | Admit: 2013-04-30 | Discharge: 2013-04-30 | Disposition: A | Discharge status: Home / Self Care | Payer: Medicare PPO | Source: Ambulatory Visit

## 2013-04-30 DIAGNOSIS — Z1231 Encounter for screening mammogram for malignant neoplasm of breast: Secondary | ICD-10-CM

## 2013-09-10 ENCOUNTER — Other Ambulatory Visit (INDEPENDENT_AMBULATORY_CARE_PROVIDER_SITE_OTHER): Payer: Medicare PPO

## 2013-09-10 DIAGNOSIS — I10 Essential (primary) hypertension: Secondary | ICD-10-CM

## 2013-09-10 DIAGNOSIS — Z0000 Encounter for general adult medical examination without abnormal findings: Secondary | ICD-10-CM

## 2013-09-10 LAB — BASIC METABOLIC PANEL
BUN: 14 mg/dL (ref 6–23)
GFR: 64.47 mL/min (ref >60.00)
Potassium: 4.5 mEq/L (ref 3.5–5.1)
Sodium: 140 mEq/L (ref 135–145)

## 2013-09-10 LAB — CBC WITH DIFFERENTIAL/PLATELET
Eosinophils Relative: 3 % (ref 0.0–5.0)
HCT: 43.9 % (ref 36.0–46.0)
Hemoglobin: 14.6 g/dL (ref 12.0–15.0)
Lymphs Abs: 1.2 10*3/uL (ref 0.7–4.0)
Monocytes Relative: 7.8 % (ref 3.0–12.0)
Platelets: 180 10*3/uL (ref 150.0–400.0)
WBC: 4.8 10*3/uL (ref 4.5–10.5)

## 2013-09-10 LAB — HEPATIC FUNCTION PANEL
ALT: 15 U/L (ref 0–35)
AST: 22 U/L (ref 0–37)
Bilirubin, Direct: 0 mg/dL (ref 0.0–0.3)
Total Bilirubin: 0.6 mg/dL (ref 0.3–1.2)

## 2013-09-10 LAB — LIPID PANEL
Cholesterol: 280 mg/dL — ABNORMAL HIGH (ref 0–200)
Total CHOL/HDL Ratio: 4
Triglycerides: 79 mg/dL (ref 0.0–149.0)
VLDL: 15.8 mg/dL (ref 0.0–40.0)

## 2013-09-10 LAB — TSH: TSH: 3.07 u[IU]/mL (ref 0.35–5.50)

## 2013-09-16 ENCOUNTER — Other Ambulatory Visit: Payer: Medicare PPO

## 2013-09-23 ENCOUNTER — Ambulatory Visit (INDEPENDENT_AMBULATORY_CARE_PROVIDER_SITE_OTHER): Payer: Medicare PPO | Admitting: Family Medicine

## 2013-09-23 ENCOUNTER — Encounter: Payer: Self-pay | Admitting: Family Medicine

## 2013-09-23 VITALS — BP 122/80 | Temp 98.1°F | Ht 65.75 in | Wt 178.0 lb

## 2013-09-23 DIAGNOSIS — Z23 Encounter for immunization: Secondary | ICD-10-CM

## 2013-09-23 DIAGNOSIS — I10 Essential (primary) hypertension: Secondary | ICD-10-CM

## 2013-09-29 NOTE — Progress Notes (Signed)
  Subjective:    Patient ID: Whitney Ray, female    DOB: 02-Aug-1941, 72 y.o.   MRN: [id number]  HPI    Review of Systems     Objective:   Physical Exam        Assessment & Plan:  Patient was not seen - no charge

## 2013-10-01 ENCOUNTER — Other Ambulatory Visit: Payer: Self-pay | Admitting: Family Medicine

## 2013-12-30 ENCOUNTER — Other Ambulatory Visit: Payer: Self-pay | Admitting: Family Medicine

## 2014-01-31 ENCOUNTER — Ambulatory Visit (INDEPENDENT_AMBULATORY_CARE_PROVIDER_SITE_OTHER): Payer: Medicare PPO | Admitting: Family Medicine

## 2014-01-31 VITALS — BP 142/82 | HR 63 | Temp 98.0°F | Resp 17 | Ht 67.0 in | Wt 175.0 lb

## 2014-01-31 DIAGNOSIS — H9319 Tinnitus, unspecified ear: Secondary | ICD-10-CM

## 2014-01-31 DIAGNOSIS — H6593 Unspecified nonsuppurative otitis media, bilateral: Secondary | ICD-10-CM

## 2014-01-31 DIAGNOSIS — H9313 Tinnitus, bilateral: Secondary | ICD-10-CM

## 2014-01-31 DIAGNOSIS — H6590 Unspecified nonsuppurative otitis media, unspecified ear: Secondary | ICD-10-CM

## 2014-01-31 NOTE — Patient Instructions (Signed)
There is a small amount of clear fluid behind your eardrums that may be causing the noise you hear. Try claritin once per day as allergies are a common cuase of this, but if not imprving in next week to 10 days - return for repeat hearing test or evaluation with Ear Nose and Throat specialist. Return to the clinic or go to the nearest emergency room if any of your symptoms worsen or new symptoms occur.  Serous Otitis Media  Serous otitis media is fluid in the middle ear space. This space contains the bones for hearing and air. Air in the middle ear space helps to transmit sound.  The air gets there through the eustachian tube. This tube goes from the back of the nose (nasopharynx) to the middle ear space. It keeps the pressure in the middle ear the same as the outside world. It also helps to drain fluid from the middle ear space. CAUSES  Serous otitis media occurs when the eustachian tube gets blocked. Blockage can come from:  Ear infections.  Colds and other upper respiratory infections.  Allergies.  Irritants such as cigarette smoke.  Sudden changes in air pressure (such as descending in an airplane).  Enlarged adenoids.  A mass in the nasopharynx. During colds and upper respiratory infections, the middle ear space can become temporarily filled with fluid. This can happen after an ear infection also. Once the infection clears, the fluid will generally drain out of the ear through the eustachian tube. If it does not, then serous otitis media occurs. SIGNS AND SYMPTOMS   Hearing loss.  A feeling of fullness in the ear, without pain.  Young children may not show any symptoms but may show slight behavioral changes, such as agitation, ear pulling, or crying. DIAGNOSIS  Serous otitis media is diagnosed by an ear exam. Tests may be done to check on the movement of the eardrum. Hearing exams may also be done. TREATMENT  The fluid most often goes away without treatment. If allergy is the  cause, allergy treatment may be helpful. Fluid that persists for several months may require minor surgery. A small tube is placed in the eardrum to:  Drain the fluid.  Restore the air in the middle ear space. In certain situations, antibiotics are used to avoid surgery. Surgery may be done to remove enlarged adenoids (if this is the cause). HOME CARE INSTRUCTIONS   Keep children away from tobacco smoke.  Be sure to keep any follow-up appointments. SEEK MEDICAL CARE IF:   Your hearing is not better in 3 months.  Your hearing is worse.  You have ear pain.  You have drainage from the ear.  You have dizziness.  You have serous otitis media only in one ear or have any bleeding from your nose (epistaxis).  You notice a lump on your neck. MAKE SURE YOU:  Understand these instructions.   Will watch your condition.   Will get help right away if you are not doing well or get worse.  Document Released: 02/15/2004 Document Revised: 07/28/2013 Document Reviewed: 06/22/2013 [facility] Patient Information 2014 [place], [place].

## 2014-01-31 NOTE — Progress Notes (Signed)
Subjective:    Patient ID: Whitney Ray, female    DOB: 1941/03/07, 73 y.o.   MRN: [id number]  HPI Whitney Ray is a 73 y.o. female  Noises in both ears, off and on for past 6 weeks. wakes up at times with noise, seems to change with movement.  "butterlfy wings or bird chirping sounds".  Occasionally during day, more at night. Hearing normally, no discharge form ears, no pain. No fever. No regular headache - noted one this am. Denies congestion or runny nose. Denies cough or sneeze, no throat clearing or PND.  Usually healthy.   Dr. Sherren Mocha - PCP.  Per problem list, mixed hearing loss bilaterral noted 2009, but she does not remember this. Does not wear hearing aids.   No attempted treatments.   Patient Active Problem List   Diagnosis Date Noted  . Dysplastic nevi 09/26/2011  . SOLAR KERATOSIS 08/31/2009  . MIXED HEARING LOSS BILATERAL 08/16/2008  . METABOLIC SYNDROME X 12/01/8249  . HYPERTENSION 08/07/2007  . MITRAL VALVE PROLAPSE 08/07/2007  . ALLERGIC RHINITIS 08/07/2007  . DERMATITIS, CNTCT, ACUTE D/T SOLAR RADIATION 08/07/2007  . URINARY INCONTINENCE 08/07/2007   Past Medical History  Diagnosis Date  . Allergic rhinitis   . Hypertension   . Urinary incontinence   . PMS (premenstrual syndrome)   . Mitral valve prolapse   . Sun-damaged skin     chronic  . Metabolic syndrome     elevated bs, elevated cholesterol obese   No past surgical history on file. Allergies  Allergen Reactions  . Penicillins     REACTION: hives   Prior to Admission medications   Medication Sig Start Date End Date Taking? Authorizing Provider  aspirin 81 MG tablet Take 81 mg by mouth daily.     Yes Historical Provider, MD  ferrous gluconate (FERGON) 325 MG tablet Take 325 mg by mouth daily with breakfast.     Yes Historical Provider, MD  fish oil-omega-3 fatty acids 1000 MG capsule Take 2 g by mouth daily.     Yes Historical Provider, MD  hydrochlorothiazide (HYDRODIURIL) 25 MG tablet take 1  tablet by mouth once daily 12/30/13  Yes Dorena Cookey, MD  Magnesium 250 MG TABS Take by mouth.     Yes Historical Provider, MD  MULTIPLE VITAMIN PO Take by mouth.     Yes Historical Provider, MD  PREMARIN vaginal cream PLACE VAGINALLY DAILY. 10/01/13  Yes Dorena Cookey, MD   History   Social History  . Marital Status: Married    Spouse Name: N/A    Number of Children: N/A  . Years of Education: N/A   Occupational History  . Not on file.   Social History Main Topics  . Smoking status: Never Smoker   . Smokeless tobacco: Not on file  . Alcohol Use: No  . Drug Use: No  . Sexual Activity:    Other Topics Concern  . Not on file   Social History Narrative  . No narrative on file       Review of Systems  Constitutional: Negative for fever and chills.  HENT: Positive for tinnitus. Negative for congestion, ear discharge, ear pain, hearing loss, postnasal drip, rhinorrhea, sinus pressure, sore throat and trouble swallowing.   Respiratory: Negative for cough and shortness of breath.   Skin: Negative for rash.   And as above.     Objective:   Physical Exam  Vitals reviewed. Constitutional: She is oriented to person, place, and  time. She appears well-developed and well-nourished. No distress.  HENT:  Head: Normocephalic and atraumatic.  Right Ear: Hearing, tympanic membrane, external ear and ear canal normal. Tympanic membrane is not erythematous, not retracted and not bulging.  Left Ear: Hearing, tympanic membrane, external ear and ear canal normal. Tympanic membrane is not erythematous, not retracted and not bulging.  Ears:  Nose: Nose normal.  Mouth/Throat: Oropharynx is clear and moist. No oropharyngeal exudate.  Eyes: Conjunctivae and EOM are normal. Pupils are equal, round, and reactive to light.  Neck: Trachea normal. Carotid bruit is not present.  Cardiovascular: Normal rate, regular rhythm, normal heart sounds and intact distal pulses.   No murmur  heard. Pulmonary/Chest: Effort normal and breath sounds normal. No respiratory distress. She has no wheezes. She has no rhonchi.  Neurological: She is alert and oriented to person, place, and time.  Skin: Skin is warm and dry. No rash noted.  Psychiatric: She has a normal mood and affect. Her behavior is normal.   Filed Vitals:   01/31/14 0952  BP: 142/82  Pulse: 63  Temp: 98 F (36.7 C)  TempSrc: Oral  Resp: 17  Height: 5\' 7"  (1.702 m)  Weight: 175 lb (79.379 kg)  SpO2: 99%      Assessment & Plan:  Whitney Ray is a 73 y.o. female Tinnitus of both ears  Bilateral serous otitis media  6 week hx of intermittent noise, and alternates ears.  Does not give history of vascular tinnitus, and no bruit on carotids appreciated. Suspected serous otitis media/ETD. Trial of claritin for next week to 10 days - SED, anticholinergic precautions. If not improving or worsening in that time (more headache or persistent, or pulsatile nature) - rtc sooner for eval. Understanding expressed.   No orders of the defined types were placed in this encounter.   Patient Instructions  There is a small amount of clear fluid behind your eardrums that may be causing the noise you hear. Try claritin once per day as allergies are a common cuase of this, but if not imprving in next week to 10 days - return for repeat hearing test or evaluation with Ear Nose and Throat specialist. Return to the clinic or go to the nearest emergency room if any of your symptoms worsen or new symptoms occur.  Serous Otitis Media  Serous otitis media is fluid in the middle ear space. This space contains the bones for hearing and air. Air in the middle ear space helps to transmit sound.  The air gets there through the eustachian tube. This tube goes from the back of the nose (nasopharynx) to the middle ear space. It keeps the pressure in the middle ear the same as the outside world. It also helps to drain fluid from the middle ear  space. CAUSES  Serous otitis media occurs when the eustachian tube gets blocked. Blockage can come from:  Ear infections.  Colds and other upper respiratory infections.  Allergies.  Irritants such as cigarette smoke.  Sudden changes in air pressure (such as descending in an airplane).  Enlarged adenoids.  A mass in the nasopharynx. During colds and upper respiratory infections, the middle ear space can become temporarily filled with fluid. This can happen after an ear infection also. Once the infection clears, the fluid will generally drain out of the ear through the eustachian tube. If it does not, then serous otitis media occurs. SIGNS AND SYMPTOMS   Hearing loss.  A feeling of fullness in the ear,  without pain.  Young children may not show any symptoms but may show slight behavioral changes, such as agitation, ear pulling, or crying. DIAGNOSIS  Serous otitis media is diagnosed by an ear exam. Tests may be done to check on the movement of the eardrum. Hearing exams may also be done. TREATMENT  The fluid most often goes away without treatment. If allergy is the cause, allergy treatment may be helpful. Fluid that persists for several months may require minor surgery. A small tube is placed in the eardrum to:  Drain the fluid.  Restore the air in the middle ear space. In certain situations, antibiotics are used to avoid surgery. Surgery may be done to remove enlarged adenoids (if this is the cause). HOME CARE INSTRUCTIONS   Keep children away from tobacco smoke.  Be sure to keep any follow-up appointments. SEEK MEDICAL CARE IF:   Your hearing is not better in 3 months.  Your hearing is worse.  You have ear pain.  You have drainage from the ear.  You have dizziness.  You have serous otitis media only in one ear or have any bleeding from your nose (epistaxis).  You notice a lump on your neck. MAKE SURE YOU:  Understand these instructions.   Will watch your  condition.   Will get help right away if you are not doing well or get worse.  Document Released: 02/15/2004 Document Revised: 07/28/2013 Document Reviewed: 06/22/2013 [facility] Patient Information 2014 [place], [place].

## 2014-09-08 ENCOUNTER — Ambulatory Visit (INDEPENDENT_AMBULATORY_CARE_PROVIDER_SITE_OTHER): Payer: Medicare PPO | Admitting: Physician Assistant

## 2014-09-08 VITALS — BP 108/62 | HR 64 | Temp 97.9°F | Resp 18 | Ht 67.0 in | Wt 176.0 lb

## 2014-09-08 DIAGNOSIS — Z23 Encounter for immunization: Secondary | ICD-10-CM

## 2014-09-08 NOTE — Progress Notes (Signed)
Pt here for Tdap and yearly Flu vaccine.

## 2014-10-31 ENCOUNTER — Other Ambulatory Visit: Payer: Self-pay | Admitting: Family Medicine

## 2014-11-29 ENCOUNTER — Ambulatory Visit (INDEPENDENT_AMBULATORY_CARE_PROVIDER_SITE_OTHER): Payer: Medicare PPO | Admitting: Family Medicine

## 2014-11-29 VITALS — BP 132/60 | HR 79 | Temp 98.2°F | Resp 18 | Ht 67.0 in | Wt 175.0 lb

## 2014-11-29 DIAGNOSIS — I10 Essential (primary) hypertension: Secondary | ICD-10-CM

## 2014-11-29 DIAGNOSIS — E785 Hyperlipidemia, unspecified: Secondary | ICD-10-CM

## 2014-11-29 DIAGNOSIS — R42 Dizziness and giddiness: Secondary | ICD-10-CM | POA: Insufficient documentation

## 2014-11-29 LAB — COMPLETE METABOLIC PANEL WITH GFR
ALK PHOS: 58 U/L (ref 39–117)
ALT: 9 U/L (ref 0–35)
AST: 16 U/L (ref 0–37)
Albumin: 4.1 g/dL (ref 3.5–5.2)
BUN: 14 mg/dL (ref 6–23)
CO2: 29 mEq/L (ref 19–32)
CREATININE: 0.82 mg/dL (ref 0.50–1.10)
Calcium: 9.2 mg/dL (ref 8.4–10.5)
Chloride: 102 mEq/L (ref 96–112)
GFR, EST NON AFRICAN AMERICAN: 71 mL/min
GFR, Est African American: 82 mL/min
Glucose, Bld: 95 mg/dL (ref 70–99)
Potassium: 3.9 mEq/L (ref 3.5–5.3)
SODIUM: 141 meq/L (ref 135–145)
TOTAL PROTEIN: 6.2 g/dL (ref 6.0–8.3)
Total Bilirubin: 0.4 mg/dL (ref 0.2–1.2)

## 2014-11-29 LAB — LIPID PANEL
CHOL/HDL RATIO: 3.9 ratio
Cholesterol: 262 mg/dL — ABNORMAL HIGH (ref 0–200)
HDL: 68 mg/dL (ref >39)
LDL CALC: 181 mg/dL — AB (ref 0–99)
Triglycerides: 66 mg/dL (ref <150)
VLDL: 13 mg/dL (ref 0–40)

## 2014-11-29 LAB — TSH: TSH: 2.425 u[IU]/mL (ref 0.350–4.500)

## 2014-11-29 MED ORDER — HYDROCHLOROTHIAZIDE 25 MG PO TABS: 25.0000 mg | ORAL_TABLET | Freq: Every day | ORAL | Status: DC

## 2014-11-29 NOTE — Progress Notes (Signed)
Subjective: Patient is here for a refill of her blood pressure medication. A few weeks ago when she is at the beach she had an episode of vertigo. She ended up in the hospital. She had run out of her blood pressure medication which they think triggered this. She has only had one tiny episodes since then when she turned her head quickly. She has some Antivert but is not taking it. Her husband is a retired Software engineer. She otherwise feels well.  Objective: Blood pressure is noted. Her TMs are normal. Throat clear. Neck supple without nodes thyromegaly. No carotid bruits. Chest clear to auscultation. Heart regular without murmurs. Abdomen soft without mass or tenderness. Extremities without edema.  Assessment: Vertigo Hypertension Hypercholesterolemia  Plan: Check labs. In reviewing her old reports it appears her cholesterol always has run high but they've chosen not to treat her. I told her to it that if it is still that kind of level I would probably recommend attempting treatment. We will see what her labs come back.

## 2014-11-29 NOTE — Patient Instructions (Signed)
Continue to try to get regular exercise and watch your dietary intake  Take caution if you have any episodes of dizziness  If you keep having vertigo spells please return  We will let you know the results of your labs in a few days

## 2014-12-01 MED ORDER — PRAVASTATIN SODIUM 20 MG PO TABS: 20.0000 mg | ORAL_TABLET | Freq: Every day | ORAL | Status: DC

## 2014-12-01 NOTE — Addendum Note (Signed)
Addended by: Venetia Night on: 12/01/2014 10:32 AM   Modules accepted: Orders

## 2015-02-02 ENCOUNTER — Other Ambulatory Visit: Payer: Self-pay | Admitting: Family Medicine

## 2015-02-06 ENCOUNTER — Other Ambulatory Visit: Payer: Self-pay | Admitting: Dermatology

## 2015-06-14 ENCOUNTER — Emergency Department (HOSPITAL_COMMUNITY)
Admission: EM | Admit: 2015-06-14 | Discharge: 2015-06-14 | Disposition: A | Discharge status: Home / Self Care | Payer: Medicare PPO | Attending: Emergency Medicine | Admitting: Emergency Medicine

## 2015-06-14 ENCOUNTER — Encounter (HOSPITAL_COMMUNITY): Payer: Self-pay

## 2015-06-14 DIAGNOSIS — Z880 Allergy status to penicillin: Secondary | ICD-10-CM | POA: Diagnosis not present

## 2015-06-14 DIAGNOSIS — Z872 Personal history of diseases of the skin and subcutaneous tissue: Secondary | ICD-10-CM | POA: Insufficient documentation

## 2015-06-14 DIAGNOSIS — Z7982 Long term (current) use of aspirin: Secondary | ICD-10-CM | POA: Diagnosis not present

## 2015-06-14 DIAGNOSIS — Z8639 Personal history of other endocrine, nutritional and metabolic disease: Secondary | ICD-10-CM | POA: Insufficient documentation

## 2015-06-14 DIAGNOSIS — Z79899 Other long term (current) drug therapy: Secondary | ICD-10-CM | POA: Diagnosis not present

## 2015-06-14 DIAGNOSIS — N390 Urinary tract infection, site not specified: Secondary | ICD-10-CM | POA: Diagnosis not present

## 2015-06-14 DIAGNOSIS — Z8709 Personal history of other diseases of the respiratory system: Secondary | ICD-10-CM | POA: Diagnosis not present

## 2015-06-14 DIAGNOSIS — R319 Hematuria, unspecified: Secondary | ICD-10-CM

## 2015-06-14 DIAGNOSIS — I10 Essential (primary) hypertension: Secondary | ICD-10-CM | POA: Insufficient documentation

## 2015-06-14 LAB — URINALYSIS, ROUTINE W REFLEX MICROSCOPIC
BILIRUBIN URINE: NEGATIVE
GLUCOSE, UA: NEGATIVE mg/dL
KETONES UR: NEGATIVE mg/dL
Nitrite: NEGATIVE
Specific Gravity, Urine: 1.012 (ref 1.005–1.030)
Urobilinogen, UA: 0.2 mg/dL (ref 0.0–1.0)
pH: 7 (ref 5.0–8.0)

## 2015-06-14 LAB — I-STAT CHEM 8, ED
BUN: 27 mg/dL — ABNORMAL HIGH (ref 6–20)
CHLORIDE: 101 mmol/L (ref 101–111)
Calcium, Ion: 1.18 mmol/L (ref 1.13–1.30)
Creatinine, Ser: 0.8 mg/dL (ref 0.44–1.00)
Glucose, Bld: 95 mg/dL (ref 65–99)
HEMATOCRIT: 43 % (ref 36.0–46.0)
Hemoglobin: 14.6 g/dL (ref 12.0–15.0)
POTASSIUM: 3.6 mmol/L (ref 3.5–5.1)
SODIUM: 141 mmol/L (ref 135–145)
TCO2: 30 mmol/L (ref 0–100)

## 2015-06-14 LAB — URINE MICROSCOPIC-ADD ON

## 2015-06-14 MED ORDER — DEXTROSE 5 % IV SOLN
1.0000 g | Freq: Once | INTRAVENOUS | Status: AC
  Administered 2015-06-14: 1 g via INTRAVENOUS
  Filled 2015-06-14: qty 10

## 2015-06-14 MED ORDER — CEPHALEXIN 500 MG PO CAPS: 500.0000 mg | ORAL_CAPSULE | Freq: Two times a day (BID) | ORAL | Status: DC

## 2015-06-14 NOTE — ED Notes (Signed)
Pt complains of hematuria since about 3am, there is blood present in the urine and there were clots in the toilet

## 2015-06-14 NOTE — Discharge Instructions (Signed)

## 2015-06-14 NOTE — ED Provider Notes (Addendum)
TIME SEEN: 5:30 AM  CHIEF COMPLAINT: dysuria, hematuria  HPI: Pt is a 74 y.o. F with no sig PMH who presents to the ED with dysuria and hematuria started at 5 AM. Denies fevers, chills, nausea, vomiting or diarrhea. No flank or abdominal pain. Has never had a urinary tract infection before. No history of kidney stones. Not on anticoagulation. No history of any bleeding disorder. States she noticed a couple of small clots in the toilet. She reports she is able to empty her bladder and has no difficulty urinating other than mild pain.  ROS: See HPI Constitutional: no fever  Eyes: no drainage  ENT: no runny nose   Cardiovascular:  no chest pain  Resp: no SOB  GI: no vomiting GU:  dysuria Integumentary: no rash  Allergy: no hives  Musculoskeletal: no leg swelling  Neurological: no slurred speech ROS otherwise negative  PAST MEDICAL HISTORY/PAST SURGICAL HISTORY:  Past Medical History  Diagnosis Date  . Allergic rhinitis   . Hypertension   . Urinary incontinence   . PMS (premenstrual syndrome)   . Mitral valve prolapse   . Sun-damaged skin     chronic  . Metabolic syndrome     elevated bs, elevated cholesterol obese    MEDICATIONS:  Prior to Admission medications   Medication Sig Start Date End Date Taking? Authorizing Provider  acetaminophen (TYLENOL) 500 MG tablet Take 500 mg by mouth every 6 (six) hours as needed for headache.   Yes Historical Provider, MD  aspirin 81 MG tablet Take 81 mg by mouth daily.     Yes Historical Provider, MD  ferrous gluconate (FERGON) 325 MG tablet Take 325 mg by mouth daily with breakfast.     Yes Historical Provider, MD  hydrochlorothiazide (HYDRODIURIL) 25 MG tablet Take 1 tablet (25 mg total) by mouth daily. 11/29/14  Yes [name], MD  naproxen sodium (ANAPROX) 220 MG tablet Take 440 mg by mouth 2 (two) times daily as needed (pain).   Yes Historical Provider, MD  PATADAY 0.2 % SOLN Place 1 drop into both eyes daily as needed. allergies  05/11/15  Yes Historical Provider, MD  pravastatin (PRAVACHOL) 20 MG tablet Take 1 tablet (20 mg total) by mouth daily. 12/01/14  Yes [name], MD  PREMARIN vaginal cream PLACE VAGINALLY DAILY. Patient not taking: Reported on 06/14/2015 10/01/13   Dorena Cookey, MD    ALLERGIES:  Allergies  Allergen Reactions  . Penicillins     REACTION: hives    SOCIAL HISTORY:  History  Substance Use Topics  . Smoking status: Never Smoker   . Smokeless tobacco: Not on file  . Alcohol Use: No    FAMILY HISTORY: History reviewed. No pertinent family history.  EXAM: BP 184/75 mmHg  Pulse 83  Temp(Src) 98.2 F (36.8 C)  Resp 20  Ht 5\' 2"  (1.575 m)  Wt 170 lb (77.111 kg)  BMI 31.09 kg/m2  SpO2 98% CONSTITUTIONAL: Alert and oriented and responds appropriately to questions. Well-appearing; well-nourished, elderly, in no distress, nontoxic, smiling, pleasant HEAD: Normocephalic EYES: Conjunctivae clear, PERRL ENT: normal nose; no rhinorrhea; moist mucous membranes; pharynx without lesions noted NECK: Supple, no meningismus, no LAD  CARD: RRR; S1 and S2 appreciated; no murmurs, no clicks, no rubs, no gallops RESP: Normal chest excursion without splinting or tachypnea; breath sounds clear and equal bilaterally; no wheezes, no rhonchi, no rales, no hypoxia or respiratory distress, speaking full sentences ABD/GI: Normal bowel sounds; non-distended; soft, non-tender, no rebound, no  guarding, no peritoneal signs BACK:  The back appears normal and is non-tender to palpation, there is no CVA tenderness EXT: Normal ROM in all joints; non-tender to palpation; no edema; normal capillary refill; no cyanosis, no calf tenderness or swelling    SKIN: Normal color for age and race; warm NEURO: Moves all extremities equally, sensation to light touch intact diffusely, cranial nerves II through XII intact PSYCH: The patient's mood and manner are appropriate. Grooming and personal hygiene are  appropriate.  MEDICAL DECISION MAKING: Patient here with urinary tract infection. Urine culture pending. We'll give ceftriaxone. She is very well-appearing, afebrile, hemodynamically stable, nontoxic. No complaints of pain currently. Basic labs are unremarkable with normal creatinine.  I feel she is safe to be discharged home on Keflex. Have recommended close outpatient follow-up. Discussed return precautions. She verbalizes understanding and is comfortable with this plan.       [place], DO 06/14/15 [place], DO 06/14/15 [phone]

## 2015-06-14 NOTE — ED Notes (Signed)
Pt complained of burning upon urination at home but did not have that here

## 2015-06-16 LAB — URINE CULTURE: Culture: >=100000

## 2015-06-20 ENCOUNTER — Telehealth: Payer: Self-pay | Admitting: *Deleted

## 2015-06-20 NOTE — ED Notes (Signed)
(+)  urine culture, treated with Cephalexin, OK per Cherlynn Polo, Pharm

## 2015-08-11 DIAGNOSIS — Z01419 Encounter for gynecological examination (general) (routine) without abnormal findings: Secondary | ICD-10-CM | POA: Diagnosis not present

## 2015-08-11 DIAGNOSIS — N390 Urinary tract infection, site not specified: Secondary | ICD-10-CM | POA: Diagnosis not present

## 2015-08-11 DIAGNOSIS — Z6827 Body mass index (BMI) 27.0-27.9, adult: Secondary | ICD-10-CM | POA: Diagnosis not present

## 2015-09-25 DIAGNOSIS — Z13820 Encounter for screening for osteoporosis: Secondary | ICD-10-CM | POA: Diagnosis not present

## 2015-09-25 DIAGNOSIS — Z1231 Encounter for screening mammogram for malignant neoplasm of breast: Secondary | ICD-10-CM | POA: Diagnosis not present

## 2015-09-25 DIAGNOSIS — N958 Other specified menopausal and perimenopausal disorders: Secondary | ICD-10-CM | POA: Diagnosis not present

## 2016-01-29 ENCOUNTER — Ambulatory Visit (INDEPENDENT_AMBULATORY_CARE_PROVIDER_SITE_OTHER): Payer: Medicare Other

## 2016-01-29 ENCOUNTER — Ambulatory Visit (INDEPENDENT_AMBULATORY_CARE_PROVIDER_SITE_OTHER): Payer: Medicare Other | Admitting: Physician Assistant

## 2016-01-29 VITALS — BP 124/56 | HR 83 | Temp 98.3°F | Resp 20 | Ht 66.5 in | Wt 167.8 lb

## 2016-01-29 DIAGNOSIS — R05 Cough: Secondary | ICD-10-CM | POA: Diagnosis not present

## 2016-01-29 DIAGNOSIS — R938 Abnormal findings on diagnostic imaging of other specified body structures: Secondary | ICD-10-CM | POA: Diagnosis not present

## 2016-01-29 DIAGNOSIS — D72819 Decreased white blood cell count, unspecified: Secondary | ICD-10-CM | POA: Diagnosis not present

## 2016-01-29 DIAGNOSIS — R059 Cough, unspecified: Secondary | ICD-10-CM

## 2016-01-29 DIAGNOSIS — R824 Acetonuria: Secondary | ICD-10-CM | POA: Diagnosis not present

## 2016-01-29 DIAGNOSIS — R9389 Abnormal findings on diagnostic imaging of other specified body structures: Secondary | ICD-10-CM

## 2016-01-29 LAB — POC MICROSCOPIC URINALYSIS (UMFC)

## 2016-01-29 LAB — POCT CBC
Granulocyte percent: 68.2 %G (ref 37–80)
HEMATOCRIT: 37.1 % — AB (ref 37.7–47.9)
Hemoglobin: 11.5 g/dL — AB (ref 12.2–16.2)
LYMPH, POC: 0.9 (ref 0.6–3.4)
MCH, POC: 24.2 pg — AB (ref 27–31.2)
MCHC: 30.9 g/dL — AB (ref 31.8–35.4)
MCV: 78.4 fL — AB (ref 80–97)
MID (cbc): 0.5 (ref 0–0.9)
MPV: 8.9 fL (ref 0–99.8)
PLATELET COUNT, POC: 156 10*3/uL (ref 142–424)
POC GRANULOCYTE: 3.1 (ref 2–6.9)
POC LYMPH %: 20.8 % (ref 10–50)
POC MID %: 11 %M (ref 0–12)
RBC: 4.73 M/uL (ref 4.04–5.48)
RDW, POC: 18.3 %
WBC: 4.5 10*3/uL — AB (ref 4.6–10.2)

## 2016-01-29 LAB — POCT URINALYSIS DIP (MANUAL ENTRY)
Bilirubin, UA: NEGATIVE
Glucose, UA: NEGATIVE
Nitrite, UA: NEGATIVE
PH UA: 5.5
Protein Ur, POC: NEGATIVE
RBC UA: NEGATIVE
Spec Grav, UA: 1.02
UROBILINOGEN UA: 0.2

## 2016-01-29 LAB — GLUCOSE, POCT (MANUAL RESULT ENTRY): POC Glucose: 111 mg/dl — AB (ref 70–99)

## 2016-01-29 LAB — POCT INFLUENZA A/B
INFLUENZA A, POC: NEGATIVE
Influenza B, POC: NEGATIVE

## 2016-01-29 MED ORDER — HYDROCODONE-HOMATROPINE 5-1.5 MG/5ML PO SYRP: 2.5000 mL | ORAL_SOLUTION | Freq: Every day | ORAL | Status: DC

## 2016-01-29 MED ORDER — AZITHROMYCIN 250 MG PO TABS: 500.0000 mg | ORAL_TABLET | Freq: Every day | ORAL | Status: DC

## 2016-01-29 NOTE — Progress Notes (Addendum)
01/29/2016 10:45 AM   DOB: October 22, 1941 / MRN: [id number]  SUBJECTIVE:  Whitney Ray is a 75 y.o. female presenting for for the evaluation of cough that started 3 days ago.  Associated symptoms include no other symptoms.treatments tried thus far include Tylenol with fair  relief. She reports sick contacts. No history of CAD, smoking, DM2 or asthma.    She is allergic to penicillins.   She  has a past medical history of Allergic rhinitis; Hypertension; Urinary incontinence; PMS (premenstrual syndrome); Mitral valve prolapse; Sun-damaged skin; and Metabolic syndrome.    She  reports that she has never smoked. She does not have any smokeless tobacco history on file. She reports that she does not drink alcohol or use illicit drugs. She  has no sexual activity history on file. The patient  has no past surgical history on file.  Her family history is not on file.  Review of Systems  Constitutional: Negative for fever and chills.  Eyes: Negative for blurred vision.  Respiratory: Positive for cough. Negative for shortness of breath.   Cardiovascular: Negative for chest pain and leg swelling.  Gastrointestinal: Negative for nausea and abdominal pain.  Genitourinary: Negative for dysuria, urgency and frequency.  Musculoskeletal: Negative for myalgias.  Skin: Negative for rash.  Neurological: Positive for weakness. Negative for dizziness, tingling and headaches.  Psychiatric/Behavioral: Negative for depression. The patient is not nervous/anxious.     Problem list and medications reviewed and updated by myself where necessary, and exist elsewhere in the encounter.   OBJECTIVE:  BP 124/56 mmHg  Pulse 83  Temp(Src) 98.3 F (36.8 C) (Oral)  Resp 20  Ht 5' 6.5" (1.689 m)  Wt 167 lb 12.8 oz (76.114 kg)  BMI 26.68 kg/m2  SpO2 98%  Physical Exam  Constitutional: She is oriented to person, place, and time. She appears well-nourished. No distress.  Eyes: EOM are normal. Pupils are equal, round,  and reactive to light.  Cardiovascular: Normal rate.   Pulmonary/Chest: Effort normal. No respiratory distress. She has no wheezes. She has no rales. She exhibits no tenderness.  Abdominal: She exhibits no distension.  Neurological: She is alert and oriented to person, place, and time. No cranial nerve deficit. Gait normal.  Skin: Skin is dry. She is not diaphoretic.  Psychiatric: She has a normal mood and affect.  Vitals reviewed.   Results for orders placed or performed in visit on 01/29/16 (from the past 48 hour(s))  POCT CBC     Status: Abnormal   Collection Time: 01/29/16  9:50 AM  Result Value Ref Range   WBC 4.5 (A) 4.6 - 10.2 K/uL   Lymph, poc 0.9 0.6 - 3.4   POC LYMPH PERCENT 20.8 10 - 50 %L   MID (cbc) 0.5 0 - 0.9   POC MID % 11.0 0 - 12 %M   POC Granulocyte 3.1 2 - 6.9   Granulocyte percent 68.2 37 - 80 %G   RBC 4.73 4.04 - 5.48 M/uL   Hemoglobin 11.5 (A) 12.2 - 16.2 g/dL   HCT, POC 37.1 (A) 37.7 - 47.9 %   MCV 78.4 (A) 80 - 97 fL   MCH, POC 24.2 (A) 27 - 31.2 pg   MCHC 30.9 (A) 31.8 - 35.4 g/dL   RDW, POC 18.3 %   Platelet Count, POC 156 142 - 424 K/uL   MPV 8.9 0 - 99.8 fL  POCT urinalysis dipstick     Status: Abnormal   Collection Time: 01/29/16 10:06 AM  Result Value Ref Range   Color, UA yellow yellow   Clarity, UA clear clear   Glucose, UA negative negative   Bilirubin, UA negative negative   Ketones, POC UA trace (5) (A) negative   Spec Grav, UA 1.020    Blood, UA negative negative   pH, UA 5.5    Protein Ur, POC negative negative   Urobilinogen, UA 0.2    Nitrite, UA Negative Negative   Leukocytes, UA Trace (A) Negative  POCT glucose (manual entry)     Status: Abnormal   Collection Time: 01/29/16 10:18 AM  Result Value Ref Range   POC Glucose 111 (A) 70 - 99 mg/dl  POCT Microscopic Urinalysis ([facility])     Status: Abnormal   Collection Time: 01/29/16 10:21 AM  Result Value Ref Range   WBC,UR,HPF,POC None None WBC/hpf   RBC,UR,HPF,POC None None  RBC/hpf   Bacteria None None, Too numerous to count   Mucus Present (A) Absent   Epithelial Cells, UR Per Microscopy None None, Too numerous to count cells/hpf  POCT Influenza A/B     Status: None   Collection Time: 01/29/16 10:35 AM  Result Value Ref Range   Influenza A, POC Negative Negative   Influenza B, POC Negative Negative   Dg Chest 2 View  01/29/2016  CLINICAL DATA:  Cough. EXAM: CHEST  2 VIEW COMPARISON:  No prior. FINDINGS: Mediastinum is normal. Heart size normal. Mild left perihilar infiltrate cannot be completely excluded. No pleural effusion or pneumothorax . No acute bony abnormality. IMPRESSION: Mild left perihilar infiltrate cannot be completely excluded . Electronically Signed   By: Marcello Moores  Register   On: 01/29/2016 10:05     ASSESSMENT AND PLAN:  Whitney Ray was seen today for cough.  Diagnoses and all orders for this visit:  Cough: She lacks other symptoms.  Rads can not exclude pneumonia, which is likely early. Will treat with Azithromycin 500 qd for three days and than advised she RTC.   -     POCT CBC -     DG Chest 2 View; Future -     POCT Influenza A/B -     POCT urinalysis dipstick -     Strep pneumoniae urinary antigen -     HYDROcodone-homatropine (HYCODAN) 5-1.5 MG/5ML syrup; Take 2.5-5 mLs by mouth at bedtime.  Leukopenia -     Pathologist smear review  Urine ketones -     Orthostatic vital signs -     POCT glucose (manual entry) -     POCT Microscopic Urinalysis ([facility])  Abnormal chest x-ray -     azithromycin (ZITHROMAX) 250 MG tablet; Take 2 tablets (500 mg total) by mouth daily. Do not miss doses.    The patient was advised to call or return to clinic if she does not see an improvement in symptoms or to seek the care of the closest emergency department if she worsens with the above plan.   Philis Fendt, MHS, PA-C [facility] 01/29/2016 10:45 AM

## 2016-01-29 NOTE — Patient Instructions (Addendum)
Because you received an x-ray today, you will receive an invoice from [facility]. Please contact [facility] at [phone] with questions or concerns regarding your invoice. Our billing staff will not be able to assist you with those questions.  Please return in 48-72 hours for a recheck.

## 2016-01-30 LAB — PATHOLOGIST SMEAR REVIEW

## 2016-01-31 ENCOUNTER — Ambulatory Visit (INDEPENDENT_AMBULATORY_CARE_PROVIDER_SITE_OTHER): Payer: Medicare Other | Admitting: Physician Assistant

## 2016-01-31 ENCOUNTER — Ambulatory Visit (INDEPENDENT_AMBULATORY_CARE_PROVIDER_SITE_OTHER): Payer: Medicare Other

## 2016-01-31 VITALS — BP 116/78 | HR 88 | Temp 98.2°F | Resp 17 | Ht 67.0 in | Wt 168.0 lb

## 2016-01-31 DIAGNOSIS — R05 Cough: Secondary | ICD-10-CM | POA: Diagnosis not present

## 2016-01-31 DIAGNOSIS — N390 Urinary tract infection, site not specified: Secondary | ICD-10-CM | POA: Diagnosis not present

## 2016-01-31 DIAGNOSIS — R059 Cough, unspecified: Secondary | ICD-10-CM

## 2016-01-31 DIAGNOSIS — R82998 Other abnormal findings in urine: Secondary | ICD-10-CM

## 2016-01-31 LAB — POCT URINALYSIS DIP (MANUAL ENTRY)
Blood, UA: NEGATIVE
Glucose, UA: NEGATIVE
Nitrite, UA: NEGATIVE
PH UA: 6
Spec Grav, UA: 1.025
UROBILINOGEN UA: 0.2

## 2016-01-31 LAB — CBC WITH DIFFERENTIAL/PLATELET
Basophils Absolute: 0 10*3/uL (ref 0.0–0.1)
Basophils Relative: 1 % (ref 0–1)
EOS ABS: 0.2 10*3/uL (ref 0.0–0.7)
EOS PCT: 5 % (ref 0–5)
HEMATOCRIT: 35.7 % — AB (ref 36.0–46.0)
Hemoglobin: 11.2 g/dL — ABNORMAL LOW (ref 12.0–15.0)
LYMPHS ABS: 1.1 10*3/uL (ref 0.7–4.0)
LYMPHS PCT: 33 % (ref 12–46)
MCH: 23.5 pg — AB (ref 26.0–34.0)
MCHC: 31.4 g/dL (ref 30.0–36.0)
MCV: 75 fL — ABNORMAL LOW (ref 78.0–100.0)
MONOS PCT: 6 % (ref 3–12)
MPV: 11.3 fL (ref 8.6–12.4)
Monocytes Absolute: 0.2 10*3/uL (ref 0.1–1.0)
Neutro Abs: 1.9 10*3/uL (ref 1.7–7.7)
Neutrophils Relative %: 55 % (ref 43–77)
PLATELETS: 186 10*3/uL (ref 150–400)
RBC: 4.76 MIL/uL (ref 3.87–5.11)
RDW: 16 % — ABNORMAL HIGH (ref 11.5–15.5)
WBC: 3.4 10*3/uL — ABNORMAL LOW (ref 4.0–10.5)

## 2016-01-31 LAB — POC MICROSCOPIC URINALYSIS (UMFC): MUCUS RE: ABSENT

## 2016-01-31 LAB — STREP PNEUMONIAE URINARY ANTIGEN: Strep Pneumo Urinary Antigen: NOT DETECTED

## 2016-01-31 MED ORDER — IPRATROPIUM BROMIDE 0.02 % IN SOLN
0.5000 mg | Freq: Once | RESPIRATORY_TRACT | Status: AC
  Administered 2016-01-31: 0.5 mg via RESPIRATORY_TRACT

## 2016-01-31 MED ORDER — HYDROCODONE-HOMATROPINE 5-1.5 MG/5ML PO SYRP: 2.5000 mL | ORAL_SOLUTION | Freq: Every evening | ORAL | Status: DC | PRN

## 2016-01-31 MED ORDER — ALBUTEROL SULFATE (2.5 MG/3ML) 0.083% IN NEBU
2.5000 mg | INHALATION_SOLUTION | Freq: Once | RESPIRATORY_TRACT | Status: AC
  Administered 2016-01-31: 2.5 mg via RESPIRATORY_TRACT

## 2016-01-31 NOTE — Addendum Note (Signed)
Addended by: Gurney Maxin C on: 01/31/2016 11:05 AM   Modules accepted: Orders

## 2016-01-31 NOTE — Patient Instructions (Signed)
Please consume lots of fluids.

## 2016-01-31 NOTE — Progress Notes (Signed)
01/31/2016 10:34 AM   DOB: October 01, 1941 / MRN: [id number]  SUBJECTIVE:  Whitney Ray is a 75 y.o. female presenting for follow up of questionable pneumonia diagnosed by me three days ago.  One chest rads radiology could not exclude left perihilar pneumonia.  She was started on azithromycin 500 mg daily for three day.  States the hycodan has helped her with cough.   She is allergic to penicillins.   She  has a past medical history of Allergic rhinitis; Hypertension; Urinary incontinence; PMS (premenstrual syndrome); Mitral valve prolapse; Sun-damaged skin; and Metabolic syndrome.    She  reports that she has never smoked. She does not have any smokeless tobacco history on file. She reports that she does not drink alcohol or use illicit drugs. She  has no sexual activity history on file. The patient  has no past surgical history on file.  Her family history is not on file.  Review of Systems  Constitutional: Negative for fever and chills.  Eyes: Negative for blurred vision.  Respiratory: Positive for cough. Negative for shortness of breath.   Cardiovascular: Negative for chest pain.  Gastrointestinal: Negative for nausea and abdominal pain.  Genitourinary: Negative for dysuria, urgency and frequency.  Musculoskeletal: Negative for myalgias.  Skin: Negative for rash.  Neurological: Negative for dizziness, tingling and headaches.  Psychiatric/Behavioral: Negative for depression. The patient is not nervous/anxious.     Problem list and medications reviewed and updated by myself where necessary, and exist elsewhere in the encounter.   OBJECTIVE:  BP 116/78 mmHg  Pulse 88  Temp(Src) 98.2 F (36.8 C) (Oral)  Resp 17  Ht 5\' 7"  (1.702 m)  Wt 168 lb (76.204 kg)  BMI 26.31 kg/m2  SpO2 98%  Wt Readings from Last 3 Encounters:  01/31/16 168 lb (76.204 kg)  01/29/16 167 lb 12.8 oz (76.114 kg)  06/14/15 170 lb (77.111 kg)   Temp Readings from Last 3 Encounters:  01/31/16 98.2 F (36.8  C) Oral  01/29/16 98.3 F (36.8 C) Oral  06/14/15 98.2 F (36.8 C)    BP Readings from Last 3 Encounters:  01/31/16 116/78  01/29/16 124/56  06/14/15 162/68   Pulse Readings from Last 3 Encounters:  01/31/16 88  01/29/16 83  06/14/15 85     Physical Exam  Constitutional: She is oriented to person, place, and time. She appears well-developed.  Eyes: EOM are normal. Pupils are equal, round, and reactive to light.  Cardiovascular: Normal rate.   Pulmonary/Chest: Effort normal.  Abdominal: She exhibits no distension.  Musculoskeletal: Normal range of motion.  Neurological: She is alert and oriented to person, place, and time. No cranial nerve deficit.  Skin: Skin is warm and dry. She is not diaphoretic.  Psychiatric: She has a normal mood and affect.  Vitals reviewed.   Results for orders placed or performed in visit on 01/31/16 (from the past 72 hour(s))  POCT urinalysis dipstick     Status: Abnormal   Collection Time: 01/31/16 10:01 AM  Result Value Ref Range   Color, UA yellow yellow   Clarity, UA hazy (A) clear   Glucose, UA negative negative   Bilirubin, UA small (A) negative   Ketones, POC UA small (15) (A) negative   Spec Grav, UA 1.025    Blood, UA negative negative   pH, UA 6.0    Protein Ur, POC =30 (A) negative   Urobilinogen, UA 0.2    Nitrite, UA Negative Negative   Leukocytes, UA small (1+) (  A) Negative   Orthostatic VS for the past 24 hrs:  BP- Lying Pulse- Lying BP- Sitting Pulse- Sitting BP- Standing at 0 minutes Pulse- Standing at 0 minutes  01/31/16 0939 135/68 mmHg 66 128/74 mmHg 68 126/79 mmHg 69    Dg Chest 2 View  01/31/2016  CLINICAL DATA:  Follow-up of upper respiratory syndrome. EXAM: CHEST  2 VIEW COMPARISON:  PA and lateral chest x-ray of January 29, 2016 revealing possible left perihilar pneumonia. FINDINGS: The lungs are borderline hyperinflated. There is no focal infiltrate. There is no pleural effusion. The perihilar regions appear  normal. The heart and pulmonary vascularity are normal. The mediastinum is normal in width. The bony thorax exhibits no acute abnormality. IMPRESSION: There is no evidence of pneumonia. Borderline hyperinflation may reflect deep inspiratory effort or air trapping secondary to acute bronchitis. Electronically Signed   By: David  Martinique M.D.   On: 01/31/2016 09:34    ASSESSMENT AND PLAN  Pierre was seen today for cough and follow-up.  Diagnoses and all orders for this visit:  Cough: Chest rads are clear.  She is feeling somewhat better. Urine shows a mild dehydration however she is compensating per orthostatics.  Will eval CBC. RTC as needed.  -     DG Chest 2 View; Future -     Orthostatic vital signs -     albuterol (PROVENTIL) (2.5 MG/3ML) 0.083% nebulizer solution 2.5 mg; Take 3 mLs (2.5 mg total) by nebulization once. -     ipratropium (ATROVENT) nebulizer solution 0.5 mg; Take 2.5 mLs (0.5 mg total) by nebulization once. -     POCT CBC -     POCT urinalysis dipstick -     HYDROcodone-homatropine (HYCODAN) 5-1.5 MG/5ML syrup; Take 2.5-5 mLs by mouth at bedtime as needed.  Leukocytes in urine: Incidentaloma.  She is asymptomatic in this regard.  Will culture per microscopy.  -     POCT Microscopic Urinalysis ([facility])    The patient was advised to call or return to clinic if she does not see an improvement in symptoms or to seek the care of the closest emergency department if she worsens with the above plan.   Philis Fendt, MHS, PA-C [facility] 01/31/2016 10:34 AM

## 2016-07-03 ENCOUNTER — Other Ambulatory Visit: Payer: Self-pay | Admitting: Family Medicine

## 2016-07-03 DIAGNOSIS — R413 Other amnesia: Secondary | ICD-10-CM

## 2016-07-24 ENCOUNTER — Other Ambulatory Visit: Payer: Medicare PPO

## 2016-07-26 ENCOUNTER — Ambulatory Visit
Admission: RE | Admit: 2016-07-26 | Discharge: 2016-07-26 | Disposition: A | Discharge status: Home / Self Care | Payer: Medicare Other | Source: Ambulatory Visit | Attending: Family Medicine | Admitting: Family Medicine

## 2016-07-26 DIAGNOSIS — R413 Other amnesia: Secondary | ICD-10-CM

## 2016-07-26 MED ORDER — GADOBENATE DIMEGLUMINE 529 MG/ML IV SOLN
15.0000 mL | Freq: Once | INTRAVENOUS | Status: AC | PRN
  Administered 2016-07-26: 15 mL via INTRAVENOUS

## 2018-01-15 ENCOUNTER — Other Ambulatory Visit: Payer: Self-pay | Admitting: Family Medicine

## 2018-01-15 DIAGNOSIS — Z1231 Encounter for screening mammogram for malignant neoplasm of breast: Secondary | ICD-10-CM

## 2018-02-06 ENCOUNTER — Other Ambulatory Visit: Payer: Self-pay | Admitting: Family Medicine

## 2018-02-06 DIAGNOSIS — N951 Menopausal and female climacteric states: Secondary | ICD-10-CM

## 2018-02-10 ENCOUNTER — Other Ambulatory Visit: Payer: Self-pay | Admitting: Family Medicine

## 2018-02-10 ENCOUNTER — Ambulatory Visit
Admission: RE | Admit: 2018-02-10 | Discharge: 2018-02-10 | Disposition: A | Discharge status: Home / Self Care | Payer: Medicare Other | Source: Ambulatory Visit | Attending: Family Medicine | Admitting: Family Medicine

## 2018-02-10 ENCOUNTER — Ambulatory Visit: Payer: Medicare Other

## 2018-02-10 DIAGNOSIS — R5381 Other malaise: Secondary | ICD-10-CM

## 2018-02-10 DIAGNOSIS — Z1231 Encounter for screening mammogram for malignant neoplasm of breast: Secondary | ICD-10-CM

## 2018-02-10 DIAGNOSIS — E2839 Other primary ovarian failure: Secondary | ICD-10-CM

## 2018-02-19 ENCOUNTER — Ambulatory Visit
Admission: RE | Admit: 2018-02-19 | Discharge: 2018-02-19 | Disposition: A | Discharge status: Home / Self Care | Payer: Medicare Other | Source: Ambulatory Visit | Attending: Family Medicine | Admitting: Family Medicine

## 2018-02-19 DIAGNOSIS — E2839 Other primary ovarian failure: Secondary | ICD-10-CM

## 2018-07-15 ENCOUNTER — Emergency Department (HOSPITAL_COMMUNITY): Payer: Medicare Other

## 2018-07-15 ENCOUNTER — Emergency Department (HOSPITAL_COMMUNITY)
Admission: EM | Admit: 2018-07-15 | Discharge: 2018-07-15 | Disposition: A | Discharge status: Home / Self Care | Payer: Medicare Other | Attending: Emergency Medicine | Admitting: Emergency Medicine

## 2018-07-15 ENCOUNTER — Other Ambulatory Visit: Payer: Self-pay

## 2018-07-15 ENCOUNTER — Encounter (HOSPITAL_COMMUNITY): Payer: Self-pay

## 2018-07-15 DIAGNOSIS — D320 Benign neoplasm of cerebral meninges: Secondary | ICD-10-CM | POA: Insufficient documentation

## 2018-07-15 DIAGNOSIS — Z79899 Other long term (current) drug therapy: Secondary | ICD-10-CM | POA: Insufficient documentation

## 2018-07-15 DIAGNOSIS — Z7982 Long term (current) use of aspirin: Secondary | ICD-10-CM | POA: Diagnosis not present

## 2018-07-15 DIAGNOSIS — I10 Essential (primary) hypertension: Secondary | ICD-10-CM | POA: Insufficient documentation

## 2018-07-15 DIAGNOSIS — R0602 Shortness of breath: Secondary | ICD-10-CM | POA: Insufficient documentation

## 2018-07-15 DIAGNOSIS — R42 Dizziness and giddiness: Secondary | ICD-10-CM | POA: Insufficient documentation

## 2018-07-15 HISTORY — DX: Other specified postprocedural states: Z98.890

## 2018-07-15 LAB — CBC WITH DIFFERENTIAL/PLATELET
BASOS PCT: 0 %
Basophils Absolute: 0 10*3/uL (ref 0.0–0.1)
EOS ABS: 0 10*3/uL (ref 0.0–0.7)
Eosinophils Relative: 1 %
HCT: 42.4 % (ref 36.0–46.0)
HEMOGLOBIN: 14.3 g/dL (ref 12.0–15.0)
Lymphocytes Relative: 18 %
Lymphs Abs: 1.1 10*3/uL (ref 0.7–4.0)
MCH: 29.8 pg (ref 26.0–34.0)
MCHC: 33.7 g/dL (ref 30.0–36.0)
MCV: 88.3 fL (ref 78.0–100.0)
Monocytes Absolute: 0.4 10*3/uL (ref 0.1–1.0)
Monocytes Relative: 7 %
NEUTROS PCT: 74 %
Neutro Abs: 4.2 10*3/uL (ref 1.7–7.7)
Platelets: 156 10*3/uL (ref 150–400)
RBC: 4.8 MIL/uL (ref 3.87–5.11)
RDW: 13.8 % (ref 11.5–15.5)
WBC: 5.7 10*3/uL (ref 4.0–10.5)

## 2018-07-15 LAB — URINALYSIS, ROUTINE W REFLEX MICROSCOPIC
BILIRUBIN URINE: NEGATIVE
Glucose, UA: NEGATIVE mg/dL
HGB URINE DIPSTICK: NEGATIVE
KETONES UR: 20 mg/dL — AB
NITRITE: NEGATIVE
PROTEIN: NEGATIVE mg/dL
Specific Gravity, Urine: 1.028 (ref 1.005–1.030)
pH: 5 (ref 5.0–8.0)

## 2018-07-15 LAB — COMPREHENSIVE METABOLIC PANEL
ALK PHOS: 54 U/L (ref 38–126)
ALT: 15 U/L (ref 0–44)
ANION GAP: 12 (ref 5–15)
AST: 20 U/L (ref 15–41)
Albumin: 3.9 g/dL (ref 3.5–5.0)
BUN: 24 mg/dL — ABNORMAL HIGH (ref 8–23)
CALCIUM: 9 mg/dL (ref 8.9–10.3)
CHLORIDE: 104 mmol/L (ref 98–111)
CO2: 26 mmol/L (ref 22–32)
Creatinine, Ser: 0.95 mg/dL (ref 0.44–1.00)
GFR calc Af Amer: >60 mL/min (ref >60)
GFR calc non Af Amer: 56 mL/min — ABNORMAL LOW (ref >60)
GLUCOSE: 116 mg/dL — AB (ref 70–99)
Potassium: 3.6 mmol/L (ref 3.5–5.1)
SODIUM: 142 mmol/L (ref 135–145)
Total Bilirubin: 0.8 mg/dL (ref 0.3–1.2)
Total Protein: 6.7 g/dL (ref 6.5–8.1)

## 2018-07-15 LAB — LIPASE, BLOOD: Lipase: 28 U/L (ref 11–51)

## 2018-07-15 LAB — CBG MONITORING, ED: Glucose-Capillary: 122 mg/dL — ABNORMAL HIGH (ref 70–99)

## 2018-07-15 MED ORDER — GADOBENATE DIMEGLUMINE 529 MG/ML IV SOLN
20.0000 mL | Freq: Once | INTRAVENOUS | Status: AC | PRN
  Administered 2018-07-15: 16 mL via INTRAVENOUS

## 2018-07-15 NOTE — ED Notes (Signed)
Pt walked to restroom with standby assist. 

## 2018-07-15 NOTE — ED Notes (Signed)
Pt sts this morning she suddenly got very dizzy, felt as the room was spinning, became very nauseous. Episode lasted only a short time, but pt sts she thought if it lasted any longer "I wouldn't live to tell about it". Pt sts she was very shaky as well, however now the symptoms have resolved.

## 2018-07-15 NOTE — ED Notes (Signed)
pure wick in place to obtain urine specimen

## 2018-07-15 NOTE — ED Triage Notes (Signed)
Patient states she had dizziness when she attempted to get out of bed this AM. Patient denies any pain or SOB. Patient states, "I felt nervous throughout my body."

## 2018-07-15 NOTE — ED Notes (Signed)
Patient transported to MRI 

## 2018-07-15 NOTE — Discharge Instructions (Signed)
Your imaging today showed your tumor is slightly increased either due to changes from your recent gamma knife therapy versus growth.  We did not see evidence of edema, stroke, or bleeding.  Given your resolution of symptoms, we do not feel that you need to be admitted or transferred to another facility however we do want you to contact your neurology/neurosurgery/cancer team for further evaluation and management.  This is likely recurrence of your previous vertigo.  Please stay hydrated.  If any symptoms change or worsen, please return to the nearest emergency department.

## 2018-07-15 NOTE — ED Provider Notes (Signed)
[facility] DEPT Provider Note   CSN: [id number] Arrival date & time: 07/15/18  2947     History   Chief Complaint Chief Complaint  Patient presents with  . Dizziness    HPI Whitney Ray is a 77 y.o. female.  The history is provided by the patient, the spouse, a relative and medical records.  Neurologic Problem  This is a new problem. The current episode started 1 to 2 hours ago. The problem occurs constantly. The problem has been resolved. Associated symptoms include headaches (2 days ago resolved) and shortness of breath (brief and resolved). Pertinent negatives include no chest pain and no abdominal pain. Nothing aggravates the symptoms. Nothing relieves the symptoms. She has tried nothing for the symptoms. The treatment provided no relief.    Past Medical History:  Diagnosis Date  . Allergic rhinitis   . History of brain surgery   . Hypertension   . Metabolic syndrome    elevated bs, elevated cholesterol obese  . Mitral valve prolapse   . PMS (premenstrual syndrome)   . Sun-damaged skin    chronic  . Urinary incontinence     Patient Active Problem List   Diagnosis Date Noted  . Vertigo 11/29/2014  . Dysplastic nevi 09/26/2011  . SOLAR KERATOSIS 08/31/2009  . MIXED HEARING LOSS BILATERAL 08/16/2008  . METABOLIC SYNDROME X 65/46/5035  . Essential hypertension 08/07/2007  . MITRAL VALVE PROLAPSE 08/07/2007  . ALLERGIC RHINITIS 08/07/2007  . DERMATITIS, CNTCT, ACUTE D/T SOLAR RADIATION 08/07/2007  . URINARY INCONTINENCE 08/07/2007    Past Surgical History:  Procedure Laterality Date  . BRAIN SURGERY       OB History   None      Home Medications    Prior to Admission medications   Medication Sig Start Date End Date Taking? Authorizing Provider  aspirin 81 MG tablet Take 81 mg by mouth daily.      [provider]  ferrous gluconate (FERGON) 325 MG tablet Take 325 mg by mouth daily with breakfast. Reported on  01/29/2016    [provider]  hydrochlorothiazide (HYDRODIURIL) 25 MG tablet Take 1 tablet (25 mg total) by mouth daily. 11/29/14   [name], MD  HYDROcodone-homatropine [facility]) 5-1.5 MG/5ML syrup Take 2.5-5 mLs by mouth at bedtime as needed. 01/31/16   Tereasa Coop, PA-C  lisinopril (PRINIVIL,ZESTRIL) 10 MG tablet Take 10 mg by mouth daily. Reported on 01/31/2016    [provider]  naproxen sodium (ANAPROX) 220 MG tablet Take 440 mg by mouth 2 (two) times daily as needed (pain). Reported on 01/29/2016    [provider]  pravastatin (PRAVACHOL) 20 MG tablet Take 1 tablet (20 mg total) by mouth daily. Patient not taking: Reported on 01/31/2016 12/01/14   [name], MD  PREMARIN vaginal cream PLACE VAGINALLY DAILY. Patient not taking: Reported on 06/14/2015 10/01/13   Dorena Cookey, MD    Family History History reviewed. No pertinent family history.  Social History Social History   Tobacco Use  . Smoking status: Never Smoker  . Smokeless tobacco: Never Used  Substance Use Topics  . Alcohol use: No  . Drug use: No     Allergies   Penicillins   Review of Systems Review of Systems  Constitutional: Negative for chills, diaphoresis, fatigue and fever.  HENT: Negative for congestion.   Eyes: Negative for photophobia and visual disturbance.  Respiratory: Positive for shortness of breath (brief and resolved). Negative for apnea, cough  and stridor ( ).   Cardiovascular: Negative for chest pain.  Gastrointestinal: Negative for abdominal pain, constipation, diarrhea, nausea and vomiting.  Genitourinary: Negative for dysuria.  Musculoskeletal: Negative for back pain, neck pain and neck stiffness.  Skin: Negative for rash and wound.  Neurological: Positive for dizziness and headaches (2 days ago resolved). Negative for tremors, seizures, syncope, speech difficulty, weakness, light-headedness and numbness.  Psychiatric/Behavioral: Negative for  agitation.  All other systems reviewed and are negative.    Physical Exam Updated Vital Signs BP (!) 196/90 (BP Location: Left Arm)   Pulse 93   Temp 98.2 F (36.8 C) (Oral)   Resp 16   Ht 5\' 3"  (1.6 m)   Wt 76.2 kg (168 lb)   SpO2 96%   BMI 29.76 kg/m   Physical Exam  Constitutional: She appears well-developed and well-nourished. No distress.  HENT:  Head: Normocephalic and atraumatic.  Nose: Nose normal.  Mouth/Throat: Oropharynx is clear and moist. No oropharyngeal exudate.  Eyes: Pupils are equal, round, and reactive to light. Conjunctivae and EOM are normal.  Neck: Normal range of motion. Neck supple.  Cardiovascular: Normal rate, regular rhythm and intact distal pulses.  No murmur heard. Pulmonary/Chest: Effort normal and breath sounds normal. No respiratory distress. She has no wheezes. She has no rales. She exhibits no tenderness.  Abdominal: Soft. She exhibits no distension. There is no tenderness. There is no guarding.  Musculoskeletal: She exhibits no edema or tenderness.  Neurological: She is alert. She displays normal reflexes. No cranial nerve deficit or sensory deficit. She exhibits normal muscle tone. Coordination (with finger nose finger testing) normal.  Skin: Skin is warm and dry. Capillary refill takes less than 2 seconds. No rash noted. She is not diaphoretic. No erythema.  Psychiatric: She has a normal mood and affect.  Nursing note and vitals reviewed.    ED Treatments / Results  Labs (all labs ordered are listed, but only abnormal results are displayed) Labs Reviewed  COMPREHENSIVE METABOLIC PANEL - Abnormal; Notable for the following components:      Result Value   Glucose, Bld 116 (*)    BUN 24 (*)    GFR calc non Af Amer 56 (*)    All other components within normal limits  URINALYSIS, ROUTINE W REFLEX MICROSCOPIC - Abnormal; Notable for the following components:   APPearance HAZY (*)    Ketones, ur 20 (*)    Leukocytes, UA MODERATE (*)     Bacteria, UA RARE (*)    All other components within normal limits  CBG MONITORING, ED - Abnormal; Notable for the following components:   Glucose-Capillary 122 (*)    All other components within normal limits  URINE CULTURE  CBC WITH DIFFERENTIAL/PLATELET  LIPASE, BLOOD    EKG EKG Interpretation  Date/Time:  Wednesday July 15 2018 09:49:09 EDT Ventricular Rate:  66 PR Interval:    QRS Duration: 88 QT Interval:  491 QTC Calculation: 515 R Axis:   44 Text Interpretation:  Sinus rhythm Borderline short PR interval Borderline abnrm T, anterolateral leads Prolonged QT interval When comapred to prior, longer QTc.  No STEMI Confirmed by Antony Blackbird [phone]) on 07/15/2018 11:27:19 AM   Radiology Dg Chest 2 View  Result Date: 07/15/2018 CLINICAL DATA:  Dizziness upon awakening this morning and attempting to get out of bed. No associated chest pain or shortness of breath. History of hypertension and mitral valve prolapse. Nonsmoker. EXAM: CHEST - 2 VIEW COMPARISON:  Chest x-ray of January 31, 2016 FINDINGS: The lungs are adequately inflated and clear. The heart and mediastinal structures are normal. There is no pleural effusion. The bony thorax is unremarkable. IMPRESSION: There is no active cardiopulmonary disease. Electronically Signed   By: David  Martinique M.D.   On: 07/15/2018 10:22   Mr Brain W And Wo Contrast  Result Date: 07/15/2018 CLINICAL DATA:  Sudden onset of dizziness beginning today. Previous treatment for meningioma. EXAM: MRI HEAD WITHOUT AND WITH CONTRAST TECHNIQUE: Multiplanar, multiecho pulse sequences of the brain and surrounding structures were obtained without and with intravenous contrast. CONTRAST:  66mL MULTIHANCE GADOBENATE DIMEGLUMINE 529 MG/ML IV SOLN COMPARISON:  07/26/2016. FINDINGS: Brain: Diffusion imaging does not show any acute or subacute infarction or other cause of restricted diffusion. There is a background pattern of chronic small vessel ischemic change of  the pons and the cerebral hemispheric deep and subcortical white matter. No cortical or large vessel territory infarction. Posterior fossa meningioma in the right lateral corner measures larger today, 21 x 25 x 19 mm as opposed to 17 x 18 x 17 mm previously. This could represent true growth or enlargement subsequent to radiotherapy. There continues to be some invasion of the distal right transverse sinus, but flow does persist in the sinus. The tumor indents the right cerebellum but is not associated with any brain edema. No hydrocephalus. No extra-axial fluid collection. Vascular: Major vessels at the base of the brain show flow. Skull and upper cervical spine: Negative Sinuses/Orbits: Clear/normal Other: None IMPRESSION: No acute brain finding. Moderate chronic small-vessel ischemic changes of the pons and the cerebral hemispheric white matter. Posterior fossa meningioma in the right lateral corner, measured at 21 x 25 x 19 mm today as opposed to 17 x 18 x 17 mm previously. This could represent true growth or swelling of the tumor subsequent to radiotherapy. Redemonstration of invasion of the lateral aspect of the transverse sinus, but with persistent sinus flow. Electronically Signed   By: Nelson Chimes M.D.   On: 07/15/2018 11:02    Procedures Procedures (including critical care time)  Medications Ordered in ED Medications  gadobenate dimeglumine (MULTIHANCE) injection 20 mL (16 mLs Intravenous Contrast Given 07/15/18 1040)     Initial Impression / Assessment and Plan / ED Course  I have reviewed the triage vital signs and the nursing notes.  Pertinent labs & imaging results that were available during my care of the patient were reviewed by me and considered in my medical decision making (see chart for details).     Whitney Ray is a 77 y.o. female with a past medical history significant for cerebellar meningioma status post gamma knife treatment at [facility] on May 30,  hypertension, prior vertigo, and mitral valve prolapse who presents with dizziness.  Patient reports that she had a headache 2 days ago that was mild to moderate and resolved quickly.  She reports that she has not had a headache today but this morning woke up feeling unsteady.  She reports that she felt everything spinning around her and she felt like she was on a ship rocking.  She reports that it lasted for around 45 minutes and then resolved.  She reported her abdomen was "rumbling" but denied nausea or vomiting.  She denies any urinary symptoms conservation or diarrhea.  She denies any current headache, vision changes, chest pain, shortness breath, or cough.  Family reports that she may have had shortness of breath earlier this morning but she does not remember it.  She denies other complaints at this time.  On exam, patient had normal finger-nose finger testing bilaterally.  Normal extraocular movements.  No facial droop.  Clear speech.  Chest and abdomen nontender.  Lungs were clear.  Patient had normal strength and sensation in upper extremities and legs.  Clinically I am concerned patient may have a consultation with her prior cerebellar meningioma with treatment.  Patient will have MRI with and without contrast to look for edema or other problem.  Due to the possible shortness breath, visual chest x-ray.  Patient also screen laboratory testing to look for other occult infection that may have prompted her symptoms.  As patient is a symptomatic currently, will monitor patient until work-up is completed.  Anticipate reassessment after work-up.  MRI showed growth of the meningioma however it was unsure if this was due to changes from the recent gamma knife versus growth.  There is no edema, bleeding, or stroke seen.  Findings were revealed to the patient.  Suspect she had reactivation of her vertigo.  Patient denied any urinary symptoms and does not want to be treated for UTI with no nitrites.  Other  laboratory testing was reassuring.    Patient will follow-up with her neurosurgery and oncology team for further management.  Given the lack of edema, will not order steroids and have her follow-up with her primary team.    Patient understood return precautions and follow-up instructions.  Patient had no further dizziness and had no gait ab normality on my reassessment.  Patient has felt normal during her entire ED stay.  Patient discharged in good condition with resolved symptoms.  Final Clinical Impressions(s) / ED Diagnoses   Final diagnoses:  Dizziness  Vertigo    ED Discharge Orders    None      Clinical Impression: 1. Dizziness   2. Vertigo     Disposition: Discharge  Condition: Good  I have discussed the results, Dx and Tx plan with the pt(& family if present). He/she/they expressed understanding and agree(s) with the plan. Discharge instructions discussed at great length. Strict return precautions discussed and pt &/or family have verbalized understanding of the instructions. No further questions at time of discharge.    Discharge Medication List as of 07/15/2018  1:03 PM      Follow Up: Dorena Cookey, MD [place] [address] [facility] [place] [id number] [place] [id number]    Your neurology team your cancer and neurosurgery team       Calder Oblinger, Gwenyth Allegra, MD 07/15/18 1745

## 2018-07-16 LAB — URINE CULTURE

## 2019-02-20 ENCOUNTER — Encounter (HOSPITAL_BASED_OUTPATIENT_CLINIC_OR_DEPARTMENT_OTHER): Payer: Self-pay | Admitting: Emergency Medicine

## 2019-02-20 ENCOUNTER — Emergency Department (HOSPITAL_BASED_OUTPATIENT_CLINIC_OR_DEPARTMENT_OTHER): Payer: Medicare Other

## 2019-02-20 ENCOUNTER — Other Ambulatory Visit: Payer: Self-pay

## 2019-02-20 ENCOUNTER — Observation Stay (HOSPITAL_BASED_OUTPATIENT_CLINIC_OR_DEPARTMENT_OTHER)
Admission: EM | Admit: 2019-02-20 | Discharge: 2019-02-21 | Disposition: A | Discharge status: Home / Self Care | Payer: Medicare Other | Attending: Internal Medicine | Admitting: Internal Medicine

## 2019-02-20 DIAGNOSIS — S9002XA Contusion of left ankle, initial encounter: Secondary | ICD-10-CM | POA: Insufficient documentation

## 2019-02-20 DIAGNOSIS — Z9181 History of falling: Secondary | ICD-10-CM | POA: Diagnosis not present

## 2019-02-20 DIAGNOSIS — I059 Rheumatic mitral valve disease, unspecified: Secondary | ICD-10-CM | POA: Diagnosis present

## 2019-02-20 DIAGNOSIS — D72829 Elevated white blood cell count, unspecified: Secondary | ICD-10-CM | POA: Insufficient documentation

## 2019-02-20 DIAGNOSIS — Z880 Allergy status to penicillin: Secondary | ICD-10-CM | POA: Insufficient documentation

## 2019-02-20 DIAGNOSIS — S52592A Other fractures of lower end of left radius, initial encounter for closed fracture: Principal | ICD-10-CM | POA: Insufficient documentation

## 2019-02-20 DIAGNOSIS — S60222A Contusion of left hand, initial encounter: Secondary | ICD-10-CM | POA: Diagnosis not present

## 2019-02-20 DIAGNOSIS — D239 Other benign neoplasm of skin, unspecified: Secondary | ICD-10-CM | POA: Diagnosis not present

## 2019-02-20 DIAGNOSIS — S52502A Unspecified fracture of the lower end of left radius, initial encounter for closed fracture: Secondary | ICD-10-CM

## 2019-02-20 DIAGNOSIS — Z79899 Other long term (current) drug therapy: Secondary | ICD-10-CM | POA: Insufficient documentation

## 2019-02-20 DIAGNOSIS — S80211A Abrasion, right knee, initial encounter: Secondary | ICD-10-CM | POA: Diagnosis not present

## 2019-02-20 DIAGNOSIS — F0390 Unspecified dementia without behavioral disturbance: Secondary | ICD-10-CM | POA: Diagnosis not present

## 2019-02-20 DIAGNOSIS — W19XXXA Unspecified fall, initial encounter: Secondary | ICD-10-CM | POA: Diagnosis not present

## 2019-02-20 DIAGNOSIS — S92355A Nondisplaced fracture of fifth metatarsal bone, left foot, initial encounter for closed fracture: Secondary | ICD-10-CM | POA: Diagnosis not present

## 2019-02-20 DIAGNOSIS — I10 Essential (primary) hypertension: Secondary | ICD-10-CM | POA: Diagnosis not present

## 2019-02-20 DIAGNOSIS — S62109A Fracture of unspecified carpal bone, unspecified wrist, initial encounter for closed fracture: Secondary | ICD-10-CM | POA: Diagnosis present

## 2019-02-20 DIAGNOSIS — S99192A Other physeal fracture of left metatarsal, initial encounter for closed fracture: Secondary | ICD-10-CM

## 2019-02-20 HISTORY — DX: Unspecified dementia, unspecified severity, without behavioral disturbance, psychotic disturbance, mood disturbance, and anxiety: F03.90

## 2019-02-20 LAB — BASIC METABOLIC PANEL
Anion gap: 9 (ref 5–15)
BUN: 21 mg/dL (ref 8–23)
CO2: 27 mmol/L (ref 22–32)
CREATININE: 0.94 mg/dL (ref 0.44–1.00)
Calcium: 9 mg/dL (ref 8.9–10.3)
Chloride: 100 mmol/L (ref 98–111)
GFR, EST NON AFRICAN AMERICAN: 58 mL/min — AB (ref >60)
Glucose, Bld: 136 mg/dL — ABNORMAL HIGH (ref 70–99)
POTASSIUM: 3.4 mmol/L — AB (ref 3.5–5.1)
SODIUM: 136 mmol/L (ref 135–145)

## 2019-02-20 LAB — CBC WITH DIFFERENTIAL/PLATELET
ABS IMMATURE GRANULOCYTES: 0.02 10*3/uL (ref 0.00–0.07)
BASOS PCT: 0 %
Basophils Absolute: 0 10*3/uL (ref 0.0–0.1)
Eosinophils Absolute: 0.1 10*3/uL (ref 0.0–0.5)
Eosinophils Relative: 1 %
HCT: 45.3 % (ref 36.0–46.0)
HEMOGLOBIN: 14.6 g/dL (ref 12.0–15.0)
Immature Granulocytes: 0 %
LYMPHS PCT: 8 %
Lymphs Abs: 1 10*3/uL (ref 0.7–4.0)
MCH: 28.9 pg (ref 26.0–34.0)
MCHC: 32.2 g/dL (ref 30.0–36.0)
MCV: 89.5 fL (ref 80.0–100.0)
MONO ABS: 0.7 10*3/uL (ref 0.1–1.0)
MONOS PCT: 5 %
NEUTROS ABS: 11.1 10*3/uL — AB (ref 1.7–7.7)
NEUTROS PCT: 86 %
PLATELETS: 147 10*3/uL — AB (ref 150–400)
RBC: 5.06 MIL/uL (ref 3.87–5.11)
RDW: 14 % (ref 11.5–15.5)
SMEAR REVIEW: NORMAL
WBC MORPHOLOGY: ABNORMAL
WBC: 12.9 10*3/uL — ABNORMAL HIGH (ref 4.0–10.5)
nRBC: 0 % (ref 0.0–0.2)

## 2019-02-20 MED ORDER — ACETAMINOPHEN 650 MG RE SUPP: 650.0000 mg | Freq: Four times a day (QID) | RECTAL | Status: DC | PRN

## 2019-02-20 MED ORDER — HALOPERIDOL LACTATE 5 MG/ML IJ SOLN: 2.5000 mg | Freq: Once | INTRAMUSCULAR | Status: DC

## 2019-02-20 MED ORDER — TRAMADOL HCL 50 MG PO TABS: 50.0000 mg | ORAL_TABLET | Freq: Four times a day (QID) | ORAL | Status: DC | PRN

## 2019-02-20 MED ORDER — LORAZEPAM 2 MG/ML IJ SOLN
0.5000 mg | Freq: Once | INTRAMUSCULAR | Status: AC
  Administered 2019-02-20: 0.5 mg via INTRAVENOUS
  Filled 2019-02-20: qty 1

## 2019-02-20 MED ORDER — PRAVASTATIN SODIUM 20 MG PO TABS
20.0000 mg | ORAL_TABLET | Freq: Every day | ORAL | Status: DC
  Administered 2019-02-20: 20 mg via ORAL
  Filled 2019-02-20 (×2): qty 1

## 2019-02-20 MED ORDER — ASPIRIN EC 81 MG PO TBEC
81.0000 mg | DELAYED_RELEASE_TABLET | Freq: Every day | ORAL | Status: DC
  Administered 2019-02-20: 81 mg via ORAL
  Filled 2019-02-20 (×2): qty 1

## 2019-02-20 MED ORDER — ACETAMINOPHEN 325 MG PO TABS
650.0000 mg | ORAL_TABLET | Freq: Four times a day (QID) | ORAL | Status: DC | PRN
  Administered 2019-02-20 – 2019-02-21 (×2): 650 mg via ORAL
  Filled 2019-02-20 (×3): qty 2

## 2019-02-20 MED ORDER — QUETIAPINE FUMARATE 25 MG PO TABS
25.0000 mg | ORAL_TABLET | Freq: Every day | ORAL | Status: DC
  Administered 2019-02-20: 25 mg via ORAL
  Filled 2019-02-20: qty 1

## 2019-02-20 MED ORDER — HEPARIN SODIUM (PORCINE) 5000 UNIT/ML IJ SOLN
5000.0000 [IU] | Freq: Three times a day (TID) | INTRAMUSCULAR | Status: DC
  Administered 2019-02-20: 5000 [IU] via SUBCUTANEOUS
  Filled 2019-02-20: qty 1

## 2019-02-20 MED ORDER — LORAZEPAM 2 MG/ML IJ SOLN
0.5000 mg | INTRAMUSCULAR | Status: DC | PRN
  Administered 2019-02-20 – 2019-02-21 (×2): 0.5 mg via INTRAVENOUS
  Filled 2019-02-20 (×2): qty 1

## 2019-02-20 MED ORDER — DONEPEZIL HCL 10 MG PO TABS
10.0000 mg | ORAL_TABLET | Freq: Every day | ORAL | Status: DC
  Administered 2019-02-20: 10 mg via ORAL
  Filled 2019-02-20: qty 1

## 2019-02-20 MED ORDER — HYDROCHLOROTHIAZIDE 25 MG PO TABS
25.0000 mg | ORAL_TABLET | Freq: Every day | ORAL | Status: DC
  Administered 2019-02-20: 25 mg via ORAL
  Filled 2019-02-20 (×2): qty 1

## 2019-02-20 NOTE — ED Notes (Signed)
Whitney Ray (son) - [phone]

## 2019-02-20 NOTE — ED Triage Notes (Signed)
Pt brought in by EMS from home. Pt sustained unwitnessed fall. Pt c/o left wrist pain and has abrasion to right knee.  Pt has hx of dementia and does not recall fall. Pt is in C collar by EMS.

## 2019-02-20 NOTE — Progress Notes (Signed)
Paged on call provider RE pt EKG result. Awaiting new orders

## 2019-02-20 NOTE — H&P (Signed)
HPI  Whitney Ray MGQ:[id number] DOB: 19-Nov-1941 DOA: 02/20/2019  PCP: Dorena Cookey, MD   Chief Complaint: fall  HPI:  78 year old female Metabolic syndrome HTN Contact dermatitis Hearing loss  Sustained unwitnessed fall with complaining left wrist pain and abrasion to knee Her history is limited by dementia--lives with her husband and has some caregivers at home up to 18 hours a day Her son intermittently is able to help out She started having cognitive decline about 3 years ago   See below for lab and imaging findings  ED Course:   Patient kept on bedrest, casting was done of lower extremity placed on tramadol Ativan labs obtained  Review of Systems:  Cannot clearly assess given dementia  Past Medical History:  Diagnosis Date  . Allergic rhinitis   . Dementia ([place])   . History of brain surgery   . Hypertension   . Metabolic syndrome    elevated bs, elevated cholesterol obese  . Mitral valve prolapse   . PMS (premenstrual syndrome)   . Sun-damaged skin    chronic  . Urinary incontinence     Past Surgical History:  Procedure Laterality Date  . BRAIN SURGERY       reports that she has never smoked. She has never used smokeless tobacco. She reports that she does not drink alcohol or use drugs. Mobility: Independent previously  Allergies  Allergen Reactions  . Penicillins     *Family and Patient don't remember that she is allergic to this* REACTION: hives Has patient had a PCN reaction causing immediate rash, facial/tongue/throat swelling, SOB or lightheadedness with hypotension: Unknown Has patient had a PCN reaction causing severe rash involving mucus membranes or skin necrosis: Unknown Has patient had a PCN reaction that required hospitalization: Unknown Has patient had a PCN reaction occurring within the last 10 years: Unknown If all of the above answers are "NO",     No family history on file.   Prior to Admission medications   Medication Sig  Start Date End Date Taking? Authorizing Provider  donepezil (ARICEPT) 10 MG tablet Take 10 mg by mouth daily. 01/20/19  Yes [provider]  pravastatin (PRAVACHOL) 20 MG tablet Take 20 mg by mouth daily.   Yes [provider]  QUEtiapine (SEROQUEL) 25 MG tablet Take 25 mg by mouth daily. 02/03/19  Yes [provider]  hydrochlorothiazide (HYDRODIURIL) 25 MG tablet Take 1 tablet (25 mg total) by mouth daily. Patient not taking: Reported on 02/20/2019 11/29/14   [name], MD    Physical Exam:  Vitals:   02/20/19 0357 02/20/19 0527  BP: (!) 159/90 (!) 176/84  Pulse: 79 72  Resp: 18 16  Temp:  97.7 F (36.5 C)  SpO2: 100% 99%     Awake but noncoherent confused and somewhat agitated  EOMI NCAT  Moderate dentition  Mallampati 3  S1-S2 no murmur  Abdomen soft  Left hand is not wrapped in brace there is redness and edema around radius  Left leg is wrapped in the lower extremity.  She is able to wiggle toes  I have personally reviewed following labs and imaging studies  Labs:   Potassium 3.4  WBC 12.9  Imaging studies:   Ankle x-ray on admission lateral soft tissue swelling no acute bony abnormality  Foot x-ray showed transverse mildly displaced fracture base of left fifth metatarsal  X-ray of left hand show transverse impacted fracture of distal left radial metaphysis with dorsal angulation of the distal fracture  segments and degenerative changes  Knee x-ray left side showed no derangement  Medical tests:   EKG independently reviewed: Sinus rhythm PR interval 0.06 QRS axis is about 50 degrees no ST-T wave changes or other anomalies  Test discussed with performing physician:  n   Decision to obtain old records:   n   Review and summation of old records:   n   Principal Problem:   Accidental fall Nothing suggest arrhythmia or other issues at this time Patient will need to be seen by therapy and safety plan implemented  prior to Active Problems: Leukocytosis no concerns at this time for infectious cause likely stress related and would not cover   Mitral valve disease Stable at this time no issues HTN Continue HCTZ 25 daily Dementia Continue Aricept 10 daily, Seroquel 25 daily for behavioral disturbances would discontinue any other meds at this time   Dysplastic nevi   Wrist fracture   Other physeal fracture of left metatarsal, initial encounter for closed fracture Orthopedics consulted and feel patient requires splinting of the hand patient removed however the splint that was placed in the ED      Severity of Illness: The appropriate patient status for this patient is INPATIENT. Inpatient status is judged to be reasonable and necessary in order to provide the required intensity of service to ensure the patient's safety. The patient's presenting symptoms, physical exam findings, and initial radiographic and laboratory data in the context of their chronic comorbidities is felt to place them at high risk for further clinical deterioration. Furthermore, it is not anticipated that the patient will be medically stable for discharge from the hospital within 2 midnights of admission. The following factors support the patient status of inpatient.   " The patient's presenting symptoms include falla nd fracture. " The worrisome physical exam findings include displaced bones. " The initial radiographic and laboratory data are worrisome because of y. " The chronic co-morbidities include y.   * I certify that at the point of admission it is my clinical judgment that the patient will require inpatient hospital care spanning beyond 2 midnights from the point of admission due to high intensity of service, high risk for further deterioration and high frequency of surveillance required.*     DVT prophylaxis:heparin Code Status: fll Family Communication:  son Consults called:  Inpatient pending placement    Time  spent: 27 minutes  Omega Durante, MD  [facility] Direct contact: [phone] --[place] [place]  --[email or web address]; password [email or web address]  7PM-7AM contact night coverage as above  02/20/2019, 8:08 AM

## 2019-02-20 NOTE — Progress Notes (Signed)
Paged on call provider RE bradycardia.

## 2019-02-20 NOTE — Consult Note (Signed)
The patient's chart was reviewed. I was contacted by the emergency department for the left distal radius fracture. The patient does have the mildly displaced extra-articular distal radius fracture. In review of the medical chart would recommend continuation of the nonsurgical treatment of the left distal radius. The patient removed the splint initially put on by the emergency department. I think it is reasonable to place her in a short arm wrist support, Velcro wrist splint I would need to see her in the office when she is discharged from the hospital. Ice elevation Okay to use a platform walker and weight-bear through the forearm Please contact me directly if there is any questions [phone], cell, text or call

## 2019-02-20 NOTE — ED Triage Notes (Signed)
c collar removed by Dr. Wyvonnia Dusky.

## 2019-02-20 NOTE — ED Notes (Signed)
ED Provider at bedside. 

## 2019-02-20 NOTE — ED Provider Notes (Signed)
[facility] EMERGENCY DEPARTMENT Provider Note   CSN: [id number] Arrival date & time: 02/20/19  0032    History   Chief Complaint Chief Complaint  Patient presents with   Fall    HPI Whitney Ray is a 78 y.o. female.     Level 5 caveat for dementia.  Patient from home after unwitnessed fall.  Patient son states she was found in the living room when he was told that she fell.  Son reports his father was in the street trying to wave people down to help the patient. This was witnessed by her husband who also has memory issues.  Patient denies any pain unless her wrist is palpated.  She was noted to have left wrist and hand swelling and left ankle swelling.  Uncertain if she hit her head or lost consciousness.  No history of blood thinner use.  Abrasion noted to right knee.  No neck or back pain.  She is oriented to person and place.  The history is provided by the patient, the EMS personnel and a relative. The history is limited by the condition of the patient.  Fall     Past Medical History:  Diagnosis Date   Allergic rhinitis    Dementia ([place])    History of brain surgery    Hypertension    Metabolic syndrome    elevated bs, elevated cholesterol obese   Mitral valve prolapse    PMS (premenstrual syndrome)    Sun-damaged skin    chronic   Urinary incontinence     Patient Active Problem List   Diagnosis Date Noted   Vertigo 11/29/2014   Dysplastic nevi 09/26/2011   SOLAR KERATOSIS 08/31/2009   MIXED HEARING LOSS BILATERAL 37/90/2409   METABOLIC SYNDROME X 73/53/2992   Essential hypertension 08/07/2007   MITRAL VALVE PROLAPSE 08/07/2007   ALLERGIC RHINITIS 08/07/2007   DERMATITIS, CNTCT, ACUTE D/T SOLAR RADIATION 08/07/2007   URINARY INCONTINENCE 08/07/2007    Past Surgical History:  Procedure Laterality Date   BRAIN SURGERY       OB History   No obstetric history on file.      Home Medications    Prior to Admission  medications   Medication Sig Start Date End Date Taking? Authorizing Provider  hydrochlorothiazide (HYDRODIURIL) 25 MG tablet Take 1 tablet (25 mg total) by mouth daily. 11/29/14   [name], MD    Family History No family history on file.  Social History Social History   Tobacco Use   Smoking status: Never Smoker   Smokeless tobacco: Never Used  Substance Use Topics   Alcohol use: No   Drug use: No     Allergies   Penicillins   Review of Systems Review of Systems  Unable to perform ROS: Dementia     Physical Exam Updated Vital Signs BP (!) 174/93    Pulse 76    Temp 98 F (36.7 C) (Oral)    Resp 16    SpO2 98%   Physical Exam Vitals signs and nursing note reviewed.  Constitutional:      General: She is not in acute distress.    Appearance: She is well-developed.  HENT:     Head: Normocephalic and atraumatic.     Mouth/Throat:     Pharynx: No oropharyngeal exudate.  Eyes:     Conjunctiva/sclera: Conjunctivae normal.     Pupils: Pupils are equal, round, and reactive to light.  Neck:     Musculoskeletal: Normal range  of motion and neck supple.     Comments: No C-spine tenderness, step-off or deformity Cardiovascular:     Rate and Rhythm: Normal rate and regular rhythm.     Heart sounds: Normal heart sounds. No murmur.  Pulmonary:     Effort: Pulmonary effort is normal. No respiratory distress.     Breath sounds: Normal breath sounds.  Abdominal:     Palpations: Abdomen is soft.     Tenderness: There is no abdominal tenderness. There is no guarding or rebound.  Musculoskeletal:        General: Swelling and tenderness present.     Comments: Swelling and edema to left distal wrist as well as dorsal hand across first and second metacarpal.  Intact radial pulse and cardinal hand movement  Abrasion to right knee without bony tenderness  Diffuse tenderness and swelling to the left ankle worse on the lateral side.  Intact DP and PT pulses.  No T or  L-spine tenderness  Skin:    General: Skin is warm.     Capillary Refill: Capillary refill takes less than 2 seconds.  Neurological:     General: No focal deficit present.     Mental Status: She is alert.     Cranial Nerves: No cranial nerve deficit.     Motor: No abnormal muscle tone.     Coordination: Coordination normal.     Comments: No ataxia on finger to nose bilaterally. No pronator drift. 5/5 strength throughout. CN 2-12 intact.Equal grip strength. Sensation intact.  Oriented to person.  She knows it is March.  She believes the year is 46.  Psychiatric:        Behavior: Behavior normal.      ED Treatments / Results  Labs (all labs ordered are listed, but only abnormal results are displayed) Labs Reviewed  CBC WITH DIFFERENTIAL/PLATELET - Abnormal; Notable for the following components:      Result Value   WBC 12.9 (*)    Platelets 147 (*)    Neutro Abs 11.1 (*)    All other components within normal limits  BASIC METABOLIC PANEL - Abnormal; Notable for the following components:   Potassium 3.4 (*)    Glucose, Bld 136 (*)    GFR calc non Af Amer 58 (*)    All other components within normal limits  BASIC METABOLIC PANEL  MAGNESIUM  CBC  TSH  PROTIME-INR    EKG EKG Interpretation  Date/Time:  Saturday February 20 2019 03:26:12 EDT Ventricular Rate:  70 PR Interval:    QRS Duration: 64 QT Interval:  544 QTC Calculation: 588 R Axis:   65 Text Interpretation:  Sinus rhythm LAE, consider biatrial enlargement Borderline T abnormalities, diffuse leads Prolonged QT interval Baseline wander in lead(s) V2 U waves present No significant change was found Confirmed by Ezequiel Essex [phone]) on 02/20/2019 4:28:56 AM   Radiology Dg Wrist Complete Left  Result Date: 02/20/2019 CLINICAL DATA:  Bruising and swelling to the left hand and wrist after a fall. EXAM: LEFT WRIST - COMPLETE 3+ VIEW; LEFT HAND - COMPLETE 3+ VIEW COMPARISON:  None. FINDINGS: Three views of the left  hand and four views of the left wrist are obtained. There is a transverse impacted fracture of the distal left radial metaphysis with dorsal angulation of the distal fracture fragments. Associated soft tissue swelling over the wrist. No ulnar styloid process fracture. Degenerative changes in the radiocarpal and STT joints. Carpus is otherwise intact. Degenerative changes in the interphalangeal joints  most prominent distally. No additional fractures or dislocations demonstrated in the left hand. IMPRESSION: 1. Transverse impacted fracture of the distal left radial metaphysis with dorsal angulation of the distal fracture fragments. 2. Degenerative changes in the left hand and wrist. Electronically Signed   By: Lucienne Capers M.D.   On: 02/20/2019 01:44   Dg Ankle Complete Left  Result Date: 02/20/2019 CLINICAL DATA:  [place] tonight with abrasions to the right knee. Left foot and left ankle bruising and swelling. EXAM: LEFT ANKLE COMPLETE - 3+ VIEW COMPARISON:  None. FINDINGS: Lateral soft tissue swelling. No evidence of acute fracture or dislocation. No focal bone lesions or bone destruction. Mild degenerative changes in the intertarsal joints. IMPRESSION: Lateral soft tissue swelling. No acute bony abnormalities. Electronically Signed   By: Lucienne Capers M.D.   On: 02/20/2019 01:42   Ct Head Wo Contrast  Result Date: 02/20/2019 CLINICAL DATA:  Unwitnessed fall. History of dementia. EXAM: CT HEAD WITHOUT CONTRAST CT CERVICAL SPINE WITHOUT CONTRAST TECHNIQUE: Multidetector CT imaging of the head and cervical spine was performed following the standard protocol without intravenous contrast. Multiplanar CT image reconstructions of the cervical spine were also generated. COMPARISON:  MRI brain 07/15/2018 FINDINGS: CT HEAD FINDINGS Brain: Diffuse cerebral atrophy. Ventricular dilatation consistent with central atrophy. Low-attenuation changes in the deep white matter consistent with small vessel ischemia. No  mass-effect or midline shift. No abnormal extra-axial fluid collections. Gray-white matter junctions are distinct. Basal cisterns are not effaced. No acute intracranial hemorrhage. Vascular: Mild intracranial arterial vascular calcifications. Skull: Calvarium appears intact. No acute depressed skull fractures. Sinuses/Orbits: Paranasal sinuses and mastoid air cells are clear. Other: None. CT CERVICAL SPINE FINDINGS Alignment: Straightening of usual cervical lordosis without anterior subluxation. This is likely due to patient positioning but ligamentous injury or muscle spasm could also have this appearance. Normal alignment of the facet joints. C1-2 articulation appears intact. Skull base and vertebrae: Skull base appears intact. No vertebral compression deformities. No focal bone lesion or bone destruction. Bone cortex appears intact. Soft tissues and spinal canal: No prevertebral soft tissue swelling. No abnormal paraspinal soft tissue mass or infiltration. Tonsillar calcifications consistent with tonsilloliths. Vascular calcifications. Disc levels: Degenerative changes throughout the cervical spine with narrowed interspaces and endplate hypertrophic changes. Mild degenerative changes in the facet joints. Upper chest: Lung apices are clear. Other: None. IMPRESSION: 1. No acute intracranial abnormalities. Chronic atrophy and small vessel ischemic changes. 2. Nonspecific straightening of usual cervical lordosis. Degenerative changes in the cervical spine. No acute displaced fractures identified. Electronically Signed   By: Lucienne Capers M.D.   On: 02/20/2019 01:30   Ct Cervical Spine Wo Contrast  Result Date: 02/20/2019 CLINICAL DATA:  Unwitnessed fall. History of dementia. EXAM: CT HEAD WITHOUT CONTRAST CT CERVICAL SPINE WITHOUT CONTRAST TECHNIQUE: Multidetector CT imaging of the head and cervical spine was performed following the standard protocol without intravenous contrast. Multiplanar CT image  reconstructions of the cervical spine were also generated. COMPARISON:  MRI brain 07/15/2018 FINDINGS: CT HEAD FINDINGS Brain: Diffuse cerebral atrophy. Ventricular dilatation consistent with central atrophy. Low-attenuation changes in the deep white matter consistent with small vessel ischemia. No mass-effect or midline shift. No abnormal extra-axial fluid collections. Gray-white matter junctions are distinct. Basal cisterns are not effaced. No acute intracranial hemorrhage. Vascular: Mild intracranial arterial vascular calcifications. Skull: Calvarium appears intact. No acute depressed skull fractures. Sinuses/Orbits: Paranasal sinuses and mastoid air cells are clear. Other: None. CT CERVICAL SPINE FINDINGS Alignment: Straightening of usual cervical lordosis without anterior  subluxation. This is likely due to patient positioning but ligamentous injury or muscle spasm could also have this appearance. Normal alignment of the facet joints. C1-2 articulation appears intact. Skull base and vertebrae: Skull base appears intact. No vertebral compression deformities. No focal bone lesion or bone destruction. Bone cortex appears intact. Soft tissues and spinal canal: No prevertebral soft tissue swelling. No abnormal paraspinal soft tissue mass or infiltration. Tonsillar calcifications consistent with tonsilloliths. Vascular calcifications. Disc levels: Degenerative changes throughout the cervical spine with narrowed interspaces and endplate hypertrophic changes. Mild degenerative changes in the facet joints. Upper chest: Lung apices are clear. Other: None. IMPRESSION: 1. No acute intracranial abnormalities. Chronic atrophy and small vessel ischemic changes. 2. Nonspecific straightening of usual cervical lordosis. Degenerative changes in the cervical spine. No acute displaced fractures identified. Electronically Signed   By: Lucienne Capers M.D.   On: 02/20/2019 01:30   Dg Knee Complete 4 Views Right  Result Date:  02/20/2019 CLINICAL DATA:  Abrasion to the anterior right knee after a fall. EXAM: RIGHT KNEE - COMPLETE 4+ VIEW COMPARISON:  None. FINDINGS: Degenerative changes in the right knee with medial and lateral compartment narrowing and small osteophyte formation. No evidence of acute fracture or dislocation. No focal bone lesion or bone destruction. Bone cortex appears intact. No significant effusions. Soft tissues are unremarkable. IMPRESSION: Degenerative changes in the right knee. No acute bony abnormalities. Electronically Signed   By: Lucienne Capers M.D.   On: 02/20/2019 01:45   Dg Hand Complete Left  Result Date: 02/20/2019 CLINICAL DATA:  Bruising and swelling to the left hand and wrist after a fall. EXAM: LEFT WRIST - COMPLETE 3+ VIEW; LEFT HAND - COMPLETE 3+ VIEW COMPARISON:  None. FINDINGS: Three views of the left hand and four views of the left wrist are obtained. There is a transverse impacted fracture of the distal left radial metaphysis with dorsal angulation of the distal fracture fragments. Associated soft tissue swelling over the wrist. No ulnar styloid process fracture. Degenerative changes in the radiocarpal and STT joints. Carpus is otherwise intact. Degenerative changes in the interphalangeal joints most prominent distally. No additional fractures or dislocations demonstrated in the left hand. IMPRESSION: 1. Transverse impacted fracture of the distal left radial metaphysis with dorsal angulation of the distal fracture fragments. 2. Degenerative changes in the left hand and wrist. Electronically Signed   By: Lucienne Capers M.D.   On: 02/20/2019 01:44   Dg Foot Complete Left  Result Date: 02/20/2019 CLINICAL DATA:  Pain and swelling after a fall. EXAM: LEFT FOOT - COMPLETE 3+ VIEW COMPARISON:  None. FINDINGS: There is a transverse mildly displaced fracture of the base of the left fifth metatarsal bone. There is associated soft tissue swelling lateral to the foot. No additional fractures are  identified. Degenerative changes in the intertarsal joints and first metatarsal-phalangeal joint. Accessory ossicle over the navicular. IMPRESSION: Transverse mildly displaced fracture of the base of the left fifth metatarsal bone. Electronically Signed   By: Lucienne Capers M.D.   On: 02/20/2019 01:43    Procedures Procedures (including critical care time)  Medications Ordered in ED Medications - No data to display   Initial Impression / Assessment and Plan / ED Course  I have reviewed the triage vital signs and the nursing notes.  Pertinent labs & imaging results that were available during my care of the patient were reviewed by me and considered in my medical decision making (see chart for details).  Mechanical fall with swelling of left hand and wrist as well as left ankle. She appears to be at her neurological baseline per son at bedside.  Work-up shows negative CT head and C-spine.  There is a distal radius fracture on the left with impaction and mild dorsal displacement. Patient also found to have left fifth metatarsal fracture without ankle fracture.  Discussed with Dr. Caralyn Guile of hand surgery.  He agrees with splinting as is without reduction and states patient would not need admission from his standpoint. Patient does have fifth metatarsal fracture on the same side.  She will likely not be able to use a walker given her wrist fracture.  Son agreeable to admission as patient would not be able to manage a walker at home with her broken wrist. It also appears that the patient's husband has memory issues as well and patient likely needs significant assistance at home with her broken foot as well as broken wrist on the same side of her body.  Screening labs are reassuring.  EKG shows sinus rhythm with U wave.  QT interval read as prolonged but appears to be similar to previous.  Admission discussed with Dr. Sherral Hammers. Final Clinical Impressions(s) / ED Diagnoses   Final  diagnoses:  Fall, initial encounter  Closed fracture of distal end of left radius, unspecified fracture morphology, initial encounter  Nondisplaced fracture of fifth metatarsal bone, left foot, initial encounter for closed fracture    ED Discharge Orders    None       Keltin Baird, Annie Main, MD 02/20/19 [phone]

## 2019-02-20 NOTE — Evaluation (Signed)
Physical Therapy Evaluation Patient Details Name: Whitney Ray MRN: [id number] DOB: 03-06-1941 Today's Date: 02/20/2019   History of Present Illness  pt admittted after unwitnessed fall withcomplaining left wrist pain and abrasion to knee; ortho consult done/xray=mildly displaced extra-articular distal  L radius fracture. with conservative tx, splinting; pt with mildly displaced right 5th metatarsal fx  Clinical Impression  Pt admitted with above diagnosis. Pt currently with functional limitations due to the deficits listed below (see PT Problem List).  Pt requiring assist of 2 for safety, will need clarification of WBing status RLE, and if pt needs to continue posterior splint RLE as this is further impairing her balance  And incr risk for falls; attempted to call MD today but unable to reach him/no return call; RN aware and will continue efforts to clarify; DME TBD  Pt will benefit from skilled PT to increase their independence and safety with mobility to allow discharge to the venue listed below.        Follow Up Recommendations Home health PT;Supervision/Assistance - 24 hour(per son decline SNF)    Equipment Recommendations    TBD   Recommendations for Other Services       Precautions / Restrictions Precautions Precautions: Fall;Other (comment) Precaution Comments: will need clarification of WBing RLE, ? if pt will stay in RLE posterior splint  Required Braces or Orthoses: Splint/Cast Splint/Cast: L wrist splint Restrictions Weight Bearing Restrictions: Yes Other Position/Activity Restrictions: L platform ok per Dr. Caralyn Guile      Mobility  Bed Mobility               General bed mobility comments: NT--pt in chair  Transfers Overall transfer level: Needs assistance Equipment used: 2 person hand held assist Transfers: Sit to/from Stand;Stand Pivot Transfers Sit to Stand: +2 physical assistance;Min assist;Mod assist Stand pivot transfers: Mod assist;Min assist;+2  physical assistance       General transfer comment: cues for sequence, elbow WBing on R (not hand held)  Ambulation/Gait             General Gait Details: NT d/t no WBing orders for RLE  Stairs            Wheelchair Mobility    Modified Rankin (Stroke Patients Only)       Balance Overall balance assessment: Needs assistance Sitting-balance support: No upper extremity supported Sitting balance-Leahy Scale: Fair       Standing balance-Leahy Scale: Poor Standing balance comment: reliant on UEs and  external support                              Pertinent Vitals/Pain Pain Assessment: Faces Faces Pain Scale: Hurts a little bit Pain Location: RLE and R wrist Pain Descriptors / Indicators: Grimacing Pain Intervention(s): Limited activity within patient's tolerance;Monitored during session    Home Living Family/patient expects to be discharged to:: Private residence Living Arrangements: Spouse/significant other;Children Available Help at Discharge: Family;Personal care attendant Type of Home: [place]: One level        Prior Function Level of Independence: Independent with assistive device(s)         Comments: pt was independent ambulator at home, had assist with ADLs and mobility prn as needed from caregivers per pt son     Hand Dominance        Extremity/Trunk Assessment   Upper Extremity Assessment Upper Extremity Assessment: Defer to OT evaluation  Lower Extremity Assessment Lower Extremity Assessment: RLE deficits/detail RLE Deficits / Details: knee AROM WFL;  RLE: Unable to fully assess due to immobilization       Communication   Communication: No difficulties  Cognition Arousal/Alertness: Awake/alert Behavior During Therapy: Restless Overall Cognitive Status: History of cognitive impairments - at baseline                                        General Comments      Exercises      Assessment/Plan    PT Assessment Patient needs continued PT services  PT Problem List Decreased strength;Decreased activity tolerance;Decreased balance;Decreased mobility;Decreased knowledge of use of DME;Decreased cognition;Decreased knowledge of precautions       PT Treatment Interventions DME instruction;Gait training;Functional mobility training;Therapeutic activities;Therapeutic exercise;Patient/family education    PT Goals (Current goals can be found in the Care Plan section)  Acute Rehab PT Goals Patient Stated Goal: home  PT Goal Formulation: With patient Time For Goal Achievement: 02/27/19 Potential to Achieve Goals: Good    Frequency Min 3X/week   Barriers to discharge        Co-evaluation               AM-PAC PT "6 Clicks" Mobility  Outcome Measure Help needed turning from your back to your side while in a flat bed without using bedrails?: A Lot Help needed moving from lying on your back to sitting on the side of a flat bed without using bedrails?: A Lot Help needed moving to and from a bed to a chair (including a wheelchair)?: A Lot Help needed standing up from a chair using your arms (e.g., wheelchair or bedside chair)?: A Lot Help needed to walk in hospital room?: A Lot Help needed climbing 3-5 steps with a railing? : Total 6 Click Score: 11    End of Session Equipment Utilized During Treatment: Gait belt Activity Tolerance: Patient tolerated treatment well Patient left: in chair;with chair alarm set;with family/visitor present;with call bell/phone within reach   PT Visit Diagnosis: History of falling (Z91.81)    Time: 6720-9470 PT Time Calculation (min) (ACUTE ONLY): 26 min   Charges:   PT Evaluation $PT Eval Low Complexity: 1 Low PT Treatments $Therapeutic Activity: 8-22 mins        Kenyon Ana, PT  Pager: [phone] Acute Rehab Dept [facility]): [phone]   02/20/2019   [facility] 02/20/2019, 5:22 PM

## 2019-02-20 NOTE — Progress Notes (Signed)
Pt removed entire splint to left arm, will call [employer] tech to reappy with they get to work

## 2019-02-20 NOTE — ED Notes (Signed)
Pt asking repetitive questions. RN has answered questions each time and attempted to explain the next steps to patient. She was attempting to take her arm splint off. RN walked in and explained the purpose and that we have to leave the splint on. Pt states she does not want to. RN and tech have assessed skin and pms on arm, all intact. Provider made aware of patient condition and orders received.

## 2019-02-20 NOTE — ED Notes (Signed)
Pt given ice packs for wrist and ankle injuries

## 2019-02-20 NOTE — ED Notes (Signed)
Patient transported to CT 

## 2019-02-20 NOTE — Plan of Care (Signed)
Care plan initiated.

## 2019-02-21 DIAGNOSIS — W19XXXA Unspecified fall, initial encounter: Secondary | ICD-10-CM | POA: Diagnosis not present

## 2019-02-21 DIAGNOSIS — D239 Other benign neoplasm of skin, unspecified: Secondary | ICD-10-CM | POA: Diagnosis not present

## 2019-02-21 DIAGNOSIS — S52502A Unspecified fracture of the lower end of left radius, initial encounter for closed fracture: Secondary | ICD-10-CM | POA: Diagnosis not present

## 2019-02-21 LAB — PROTIME-INR
INR: 0.9 (ref 0.8–1.2)
Prothrombin Time: 12.2 seconds (ref 11.4–15.2)

## 2019-02-21 LAB — BASIC METABOLIC PANEL
Anion gap: 10 (ref 5–15)
BUN: 17 mg/dL (ref 8–23)
CO2: 27 mmol/L (ref 22–32)
Calcium: 9.4 mg/dL (ref 8.9–10.3)
Chloride: 102 mmol/L (ref 98–111)
Creatinine, Ser: 0.81 mg/dL (ref 0.44–1.00)
GFR calc Af Amer: >60 mL/min (ref >60)
Glucose, Bld: 125 mg/dL — ABNORMAL HIGH (ref 70–99)
POTASSIUM: 3.3 mmol/L — AB (ref 3.5–5.1)
Sodium: 139 mmol/L (ref 135–145)

## 2019-02-21 LAB — T4, FREE: Free T4: 0.94 ng/dL (ref 0.82–1.77)

## 2019-02-21 LAB — CBC
HCT: 45.2 % (ref 36.0–46.0)
Hemoglobin: 14.8 g/dL (ref 12.0–15.0)
MCH: 29.3 pg (ref 26.0–34.0)
MCHC: 32.7 g/dL (ref 30.0–36.0)
MCV: 89.5 fL (ref 80.0–100.0)
PLATELETS: 140 10*3/uL — AB (ref 150–400)
RBC: 5.05 MIL/uL (ref 3.87–5.11)
RDW: 14.2 % (ref 11.5–15.5)
WBC: 9 10*3/uL (ref 4.0–10.5)
nRBC: 0 % (ref 0.0–0.2)

## 2019-02-21 LAB — MAGNESIUM: Magnesium: 2.2 mg/dL (ref 1.7–2.4)

## 2019-02-21 LAB — TSH: TSH: 5.052 u[IU]/mL — ABNORMAL HIGH (ref 0.350–4.500)

## 2019-02-21 MED ORDER — POTASSIUM CHLORIDE CRYS ER 20 MEQ PO TBCR
40.0000 meq | EXTENDED_RELEASE_TABLET | Freq: Once | ORAL | Status: AC
  Administered 2019-02-21: 40 meq via ORAL
  Filled 2019-02-21: qty 2

## 2019-02-21 MED ORDER — TRAMADOL HCL 50 MG PO TABS: 50.0000 mg | ORAL_TABLET | Freq: Four times a day (QID) | ORAL | 0 refills | Status: DC | PRN

## 2019-02-21 NOTE — Care Management CC44 (Signed)
Condition Code 44 Documentation Completed  Patient Details  Name: LYNNE TAKEMOTO MRN: [id number] Date of Birth: 04/10/1941   Condition Code 44 given:  Yes Patient signature on Condition Code 44 notice:  Yes Documentation of 2 MD's agreement:  Yes Code 44 added to claim:  Yes    Dessa Phi, RN 02/21/2019, 11:07 AM

## 2019-02-21 NOTE — Discharge Summary (Signed)
Physician Discharge Summary  Whitney Ray:[id number] DOB: 08/17/1941 DOA: 02/20/2019  PCP: Dorena Cookey, MD  Admit date: 02/20/2019 Discharge date: 02/21/2019  Admitted From: Home Disposition:  Home  Recommendations for Outpatient Follow-up:  1. Follow up with PCP in 1-2 weeks  Home Health:PT   Discharge Condition:Stable CODE STATUS:Full Diet recommendation: Regular   Brief/Interim Summary: Sustained unwitnessed fall with complaining left wrist pain and abrasion to knee Her history is limited by dementia--lives with her husband and has some caregivers at home up to 18 hours a day Her son intermittently is able to help out She started having cognitive decline about 3 years ago  Discharge Diagnoses:  Principal Problem:   Accidental fall Active Problems:   Mitral valve disease   Dysplastic nevi   Wrist fracture   Other physeal fracture of left metatarsal, initial encounter for closed fracture   Leukocytosis   Fall  Principal Problem:   Accidental fall Likely mechanical fall Seen by PT with recommendation for home health, ordered  Active Problems: Leukocytosis   Afebrile  Suspect related to below acute fractures  Normalized by following day    Mitral valve disease Stable at this time no issues  HTN Continue HCTZ 25 daily  Dementia Continue Aricept 10 daily, Seroquel 25 daily for behavioral disturbances     Dysplastic nevi  Stable at this time    Wrist fracture  Seen by orthopedic surgery. Non-operative. Pt to follow up with  Orthopedic Surgery on discharge  Transverse mildly displaced fracture of base of L fifth metatarsal Seen by PT and Orthopedics CAM boot per PT recs, ordered Will arrange home health PT Pt to follow up closely with Orthopedic Surgery on discharge     Discharge Instructions   Allergies as of 02/21/2019      Reactions   Penicillins    *Family and Patient don't remember that she is allergic to this* REACTION: hives Has  patient had a PCN reaction causing immediate rash, facial/tongue/throat swelling, SOB or lightheadedness with hypotension: Unknown Has patient had a PCN reaction causing severe rash involving mucus membranes or skin necrosis: Unknown Has patient had a PCN reaction that required hospitalization: Unknown Has patient had a PCN reaction occurring within the last 10 years: Unknown If all of the above answers are "NO",       Medication List    TAKE these medications   donepezil 10 MG tablet Commonly known as:  ARICEPT Take 10 mg by mouth at bedtime.   pravastatin 20 MG tablet Commonly known as:  PRAVACHOL Take 20 mg by mouth daily.   QUEtiapine 25 MG tablet Commonly known as:  SEROQUEL Take 25 mg by mouth at bedtime.   traMADol 50 MG tablet Commonly known as:  ULTRAM Take 1 tablet (50 mg total) by mouth every 6 (six) hours as needed for moderate pain (Would be very judicious with sedating medication given patient's fall, dementia).            Durable Medical Equipment  (From admission, onward)         Start     Ordered   02/21/19 1233  For home use only DME standard manual wheelchair with seat cushion  Once    Comments:  Patient suffers from L metatarsal and wrist fracture which impairs their ability to perform daily activities like dressing in the home.  A walker will not resolve  issue with performing activities of daily living. A wheelchair will allow patient to safely perform daily  activities. Patient can safely propel the wheelchair in the home or has a caregiver who can provide assistance.  Accessories: elevating leg rests (ELRs), wheel locks, extensions and anti-tippers.   02/21/19 1234         Follow-up Information    Dorena Cookey, MD. Schedule an appointment as soon as possible for a visit in 1 week(s).   Specialty:  [place]-Home Follow up.   Specialty:  Home Health Services Why:  hh physical therapy.        Iran Planas, MD. Schedule an appointment as soon as possible for a visit.   Specialty:  Orthopedic Surgery Contact information: [address] [place] [address] [place] [address] [phone]        [facility] - Dme Follow up.   Why:  wheelchair         Allergies  Allergen Reactions  . Penicillins     *Family and Patient don't remember that she is allergic to this* REACTION: hives Has patient had a PCN reaction causing immediate rash, facial/tongue/throat swelling, SOB or lightheadedness with hypotension: Unknown Has patient had a PCN reaction causing severe rash involving mucus membranes or skin necrosis: Unknown Has patient had a PCN reaction that required hospitalization: Unknown Has patient had a PCN reaction occurring within the last 10 years: Unknown If all of the above answers are "NO",      Procedures/Studies: Dg Wrist Complete Left  Result Date: 02/20/2019 CLINICAL DATA:  Bruising and swelling to the left hand and wrist after a fall. EXAM: LEFT WRIST - COMPLETE 3+ VIEW; LEFT HAND - COMPLETE 3+ VIEW COMPARISON:  None. FINDINGS: Three views of the left hand and four views of the left wrist are obtained. There is a transverse impacted fracture of the distal left radial metaphysis with dorsal angulation of the distal fracture fragments. Associated soft tissue swelling over the wrist. No ulnar styloid process fracture. Degenerative changes in the radiocarpal and STT joints. Carpus is otherwise intact. Degenerative changes in the interphalangeal joints most prominent distally. No additional fractures or dislocations demonstrated in the left hand. IMPRESSION: 1. Transverse impacted fracture of the distal left radial metaphysis with dorsal angulation of the distal fracture fragments. 2. Degenerative changes in the left hand and wrist. Electronically Signed   By: Lucienne Capers M.D.   On: 02/20/2019 01:44   Dg Ankle Complete Left  Result Date:  02/20/2019 CLINICAL DATA:  [place] tonight with abrasions to the right knee. Left foot and left ankle bruising and swelling. EXAM: LEFT ANKLE COMPLETE - 3+ VIEW COMPARISON:  None. FINDINGS: Lateral soft tissue swelling. No evidence of acute fracture or dislocation. No focal bone lesions or bone destruction. Mild degenerative changes in the intertarsal joints. IMPRESSION: Lateral soft tissue swelling. No acute bony abnormalities. Electronically Signed   By: Lucienne Capers M.D.   On: 02/20/2019 01:42   Ct Head Wo Contrast  Result Date: 02/20/2019 CLINICAL DATA:  Unwitnessed fall. History of dementia. EXAM: CT HEAD WITHOUT CONTRAST CT CERVICAL SPINE WITHOUT CONTRAST TECHNIQUE: Multidetector CT imaging of the head and cervical spine was performed following the standard protocol without intravenous contrast. Multiplanar CT image reconstructions of the cervical spine were also generated. COMPARISON:  MRI brain 07/15/2018 FINDINGS: CT HEAD FINDINGS Brain: Diffuse cerebral atrophy. Ventricular dilatation consistent with central atrophy. Low-attenuation changes in the deep white matter consistent with small vessel ischemia. No mass-effect or midline shift. No abnormal extra-axial fluid collections.  Gray-white matter junctions are distinct. Basal cisterns are not effaced. No acute intracranial hemorrhage. Vascular: Mild intracranial arterial vascular calcifications. Skull: Calvarium appears intact. No acute depressed skull fractures. Sinuses/Orbits: Paranasal sinuses and mastoid air cells are clear. Other: None. CT CERVICAL SPINE FINDINGS Alignment: Straightening of usual cervical lordosis without anterior subluxation. This is likely due to patient positioning but ligamentous injury or muscle spasm could also have this appearance. Normal alignment of the facet joints. C1-2 articulation appears intact. Skull base and vertebrae: Skull base appears intact. No vertebral compression deformities. No focal bone lesion or bone  destruction. Bone cortex appears intact. Soft tissues and spinal canal: No prevertebral soft tissue swelling. No abnormal paraspinal soft tissue mass or infiltration. Tonsillar calcifications consistent with tonsilloliths. Vascular calcifications. Disc levels: Degenerative changes throughout the cervical spine with narrowed interspaces and endplate hypertrophic changes. Mild degenerative changes in the facet joints. Upper chest: Lung apices are clear. Other: None. IMPRESSION: 1. No acute intracranial abnormalities. Chronic atrophy and small vessel ischemic changes. 2. Nonspecific straightening of usual cervical lordosis. Degenerative changes in the cervical spine. No acute displaced fractures identified. Electronically Signed   By: Lucienne Capers M.D.   On: 02/20/2019 01:30   Ct Cervical Spine Wo Contrast  Result Date: 02/20/2019 CLINICAL DATA:  Unwitnessed fall. History of dementia. EXAM: CT HEAD WITHOUT CONTRAST CT CERVICAL SPINE WITHOUT CONTRAST TECHNIQUE: Multidetector CT imaging of the head and cervical spine was performed following the standard protocol without intravenous contrast. Multiplanar CT image reconstructions of the cervical spine were also generated. COMPARISON:  MRI brain 07/15/2018 FINDINGS: CT HEAD FINDINGS Brain: Diffuse cerebral atrophy. Ventricular dilatation consistent with central atrophy. Low-attenuation changes in the deep white matter consistent with small vessel ischemia. No mass-effect or midline shift. No abnormal extra-axial fluid collections. Gray-white matter junctions are distinct. Basal cisterns are not effaced. No acute intracranial hemorrhage. Vascular: Mild intracranial arterial vascular calcifications. Skull: Calvarium appears intact. No acute depressed skull fractures. Sinuses/Orbits: Paranasal sinuses and mastoid air cells are clear. Other: None. CT CERVICAL SPINE FINDINGS Alignment: Straightening of usual cervical lordosis without anterior subluxation. This is likely  due to patient positioning but ligamentous injury or muscle spasm could also have this appearance. Normal alignment of the facet joints. C1-2 articulation appears intact. Skull base and vertebrae: Skull base appears intact. No vertebral compression deformities. No focal bone lesion or bone destruction. Bone cortex appears intact. Soft tissues and spinal canal: No prevertebral soft tissue swelling. No abnormal paraspinal soft tissue mass or infiltration. Tonsillar calcifications consistent with tonsilloliths. Vascular calcifications. Disc levels: Degenerative changes throughout the cervical spine with narrowed interspaces and endplate hypertrophic changes. Mild degenerative changes in the facet joints. Upper chest: Lung apices are clear. Other: None. IMPRESSION: 1. No acute intracranial abnormalities. Chronic atrophy and small vessel ischemic changes. 2. Nonspecific straightening of usual cervical lordosis. Degenerative changes in the cervical spine. No acute displaced fractures identified. Electronically Signed   By: Lucienne Capers M.D.   On: 02/20/2019 01:30   Dg Knee Complete 4 Views Right  Result Date: 02/20/2019 CLINICAL DATA:  Abrasion to the anterior right knee after a fall. EXAM: RIGHT KNEE - COMPLETE 4+ VIEW COMPARISON:  None. FINDINGS: Degenerative changes in the right knee with medial and lateral compartment narrowing and small osteophyte formation. No evidence of acute fracture or dislocation. No focal bone lesion or bone destruction. Bone cortex appears intact. No significant effusions. Soft tissues are unremarkable. IMPRESSION: Degenerative changes in the right knee. No acute bony abnormalities. Electronically Signed  By: Lucienne Capers M.D.   On: 02/20/2019 01:45   Dg Hand Complete Left  Result Date: 02/20/2019 CLINICAL DATA:  Bruising and swelling to the left hand and wrist after a fall. EXAM: LEFT WRIST - COMPLETE 3+ VIEW; LEFT HAND - COMPLETE 3+ VIEW COMPARISON:  None. FINDINGS: Three  views of the left hand and four views of the left wrist are obtained. There is a transverse impacted fracture of the distal left radial metaphysis with dorsal angulation of the distal fracture fragments. Associated soft tissue swelling over the wrist. No ulnar styloid process fracture. Degenerative changes in the radiocarpal and STT joints. Carpus is otherwise intact. Degenerative changes in the interphalangeal joints most prominent distally. No additional fractures or dislocations demonstrated in the left hand. IMPRESSION: 1. Transverse impacted fracture of the distal left radial metaphysis with dorsal angulation of the distal fracture fragments. 2. Degenerative changes in the left hand and wrist. Electronically Signed   By: Lucienne Capers M.D.   On: 02/20/2019 01:44   Dg Foot Complete Left  Result Date: 02/20/2019 CLINICAL DATA:  Pain and swelling after a fall. EXAM: LEFT FOOT - COMPLETE 3+ VIEW COMPARISON:  None. FINDINGS: There is a transverse mildly displaced fracture of the base of the left fifth metatarsal bone. There is associated soft tissue swelling lateral to the foot. No additional fractures are identified. Degenerative changes in the intertarsal joints and first metatarsal-phalangeal joint. Accessory ossicle over the navicular. IMPRESSION: Transverse mildly displaced fracture of the base of the left fifth metatarsal bone. Electronically Signed   By: Lucienne Capers M.D.   On: 02/20/2019 01:43    Subjective: Eager to go home  Discharge Exam: Vitals:   02/21/19 0400 02/21/19 0501  BP:  (!) 148/85  Pulse: (!) 48 (!) 58  Resp:  20  Temp:  98.5 F (36.9 C)  SpO2:  100%   Vitals:   02/20/19 2115 02/21/19 0400 02/21/19 0501 02/21/19 1100  BP: (!) 150/82  (!) 148/85   Pulse: (!) 42 (!) 48 (!) 58   Resp:   20   Temp:   98.5 F (36.9 C)   TempSrc:   Oral   SpO2:   100%   Weight:    80.6 kg  Height:   5\' 5"  (1.651 m)     General: Pt is alert, awake, not in acute  distress Cardiovascular: RRR, S1/S2 +, no rubs, no gallops Respiratory: CTA bilaterally, no wheezing, no rhonchi Abdominal: Soft, NT, ND, bowel sounds + Extremities: no edema, no cyanosis   The results of significant diagnostics from this hospitalization (including imaging, microbiology, ancillary and laboratory) are listed below for reference.     Microbiology: No results found for this or any previous visit (from the past 240 hour(s)).   Labs: BNP (last 3 results) No results for input(s): BNP in the last 8760 hours. Basic Metabolic Panel: Recent Labs  Lab 02/20/19 0201 02/21/19 0108  NA 136 139  K 3.4* 3.3*  CL 100 102  CO2 27 27  GLUCOSE 136* 125*  BUN 21 17  CREATININE 0.94 0.81  CALCIUM 9.0 9.4  MG  --  2.2   Liver Function Tests: No results for input(s): AST, ALT, ALKPHOS, BILITOT, PROT, ALBUMIN in the last 168 hours. No results for input(s): LIPASE, AMYLASE in the last 168 hours. No results for input(s): AMMONIA in the last 168 hours. CBC: Recent Labs  Lab 02/20/19 0201 02/21/19 0108  WBC 12.9* 9.0  NEUTROABS 11.1*  --  HGB 14.6 14.8  HCT 45.3 45.2  MCV 89.5 89.5  PLT 147* 140*   Cardiac Enzymes: No results for input(s): CKTOTAL, CKMB, CKMBINDEX, TROPONINI in the last 168 hours. BNP: Invalid input(s): POCBNP CBG: No results for input(s): GLUCAP in the last 168 hours. D-Dimer No results for input(s): DDIMER in the last 72 hours. Hgb A1c No results for input(s): HGBA1C in the last 72 hours. Lipid Profile No results for input(s): CHOL, HDL, LDLCALC, TRIG, CHOLHDL, LDLDIRECT in the last 72 hours. Thyroid function studies Recent Labs    02/21/19 0108  TSH 5.052*   Anemia work up No results for input(s): VITAMINB12, FOLATE, FERRITIN, TIBC, IRON, RETICCTPCT in the last 72 hours. Urinalysis    Component Value Date/Time   COLORURINE YELLOW 07/15/2018 1210   APPEARANCEUR HAZY (A) 07/15/2018 1210   LABSPEC 1.028 07/15/2018 1210   PHURINE 5.0  07/15/2018 1210   GLUCOSEU NEGATIVE 07/15/2018 1210   HGBUR NEGATIVE 07/15/2018 1210   HGBUR negative 08/20/2010 0858   BILIRUBINUR NEGATIVE 07/15/2018 1210   BILIRUBINUR small (A) 01/31/2016 1001   BILIRUBINUR n 09/14/2012 1104   KETONESUR 20 (A) 07/15/2018 1210   PROTEINUR NEGATIVE 07/15/2018 1210   UROBILINOGEN 0.2 01/31/2016 1001   UROBILINOGEN 0.2 06/14/2015 0527   NITRITE NEGATIVE 07/15/2018 1210   LEUKOCYTESUR MODERATE (A) 07/15/2018 1210   Sepsis Labs Invalid input(s): PROCALCITONIN,  WBC,  LACTICIDVEN Microbiology No results found for this or any previous visit (from the past 240 hour(s)).  Time spent: 30 min  SIGNED:   Marylu Lund, MD  [facility] 02/21/2019, 5:54 PM  If 7PM-7AM, please contact night-coverage

## 2019-02-21 NOTE — Plan of Care (Signed)
19F on admission for left distal radial fracture s/p unwitnessed fall. This shift, pt developed asymptomatic bradycardia 42--->48. QTc on EKG earlier on the day 588. Repeat EKG obtained this shift due to bradycardia showed bigeminy with PVCs.  D/cd Seroquel and Aricept. TSH elevated. K low. Replaced K. Free T4 and Echo ordered. Please follow up.  Cautious with QTc and bradycardia causing drugs.  Please re-assess when to resume above meds and also need for synthroid. Thanks

## 2019-02-21 NOTE — TOC Progression Note (Cosign Needed)
Transition of Care [facility]) - Progression Note    Patient Details  Name: Whitney Ray MRN: [id number] Date of Birth: [birth year]  Transition of Care [facility]) CM/SW Contact  Geni Skorupski, Juliann Pulse, RN Phone Number: 02/21/2019, 1:53 PM  Clinical Narrative:   Patient suffers from L metatarsal and wrist fracture which impairs their ability to perform daily activities like dressing in the home. A walker will not resolve  issue with performing activities of daily living. A wheelchair will allow patient to safely perform daily activities. Patient can safely propel the wheelchair in the home or has a caregiver who can provide assistance.  Accessories: elevating leg rests (ELRs), wheel locks, extensions and anti-tippers.      Barriers to Discharge: No Barriers Identified  Expected Discharge Plan and Services         Expected Discharge Date: 02/21/19                         Social Determinants of Health (SDOH) Interventions    Readmission Risk Interventions 30 Day Unplanned Readmission Risk Score     ED to Hosp-Admission (Current) from 02/20/2019 in [place]  30 Day Unplanned Readmission Risk Score (%)  10 Filed at 02/21/2019 0801     This score is the patient's risk of an unplanned readmission within 30 days of being discharged (0 -100%). The score is based on dignosis, age, lab data, medications, orders, and past utilization.   Low:  0-14.9   Medium: 15-21.9   High: 22-29.9   Extreme: 30 and above       No flowsheet data found.

## 2019-02-21 NOTE — TOC Progression Note (Cosign Needed)
Transition of Care [facility]) - Progression Note    Patient Details  Name: Whitney Ray MRN: [id number] Date of Birth: [birth year]  Transition of Care [facility]) CM/SW Contact  Desmen Schoffstall, Juliann Pulse, RN Phone Number: 02/21/2019, 12:41 PM  Clinical Narrative:   For home use only DME standard manual wheelchair with seat cushion (Order [id number])  General Supply  Date: 02/21/2019 Department: [facility] Ordering/Authorizing: Donne Hazel, MD   Donne Hazel, MD NPI: [id number]    Patient Information   Patient Name Whitney Ray Sex Female DOB 1941-06-01 SSN [id number]  Order Information   Order Date/Time Release Date/Time Start Date/Time End Date/Time  02/21/19 12:34 PM None 02/21/19 12:33 PM 02/21/19 12:33 PM  Order History  Inpatient  Date/Time Action Taken User Additional Information  02/21/19 1234 Sign Donne Hazel, MD   02/21/19 1234 Release Instance Donne Hazel, MD (auto-released) Released Order: [id number]  Comments   Patient suffers from L metatarsal and wrist fracture which impairs their ability to perform daily activities like dressing in the home. A walker will not resolve  issue with performing activities of daily living. A wheelchair will allow patient to safely perform daily activities. Patient can safely propel the wheelchair in the home or has a caregiver who can provide assistance.  Accessories: elevating leg rests (ELRs), wheel locks, extensions and anti-tippers.      Standing Order Information   Remaining Occurrences Interval Last Released     0/1 Once 02/21/2019         Collection Information    Encounter   View Encounter        Tracking Links    Cosign Tracking Order Transmittal Tracking         Barriers to Discharge: No Barriers Identified  Expected Discharge Plan and Services         Expected Discharge Date: 02/21/19                         Social Determinants of Health (SDOH) Interventions     Readmission Risk Interventions 30 Day Unplanned Readmission Risk Score     ED to Hosp-Admission (Current) from 02/20/2019 in [place]  30 Day Unplanned Readmission Risk Score (%)  10 Filed at 02/21/2019 0801     This score is the patient's risk of an unplanned readmission within 30 days of being discharged (0 -100%). The score is based on dignosis, age, lab data, medications, orders, and past utilization.   Low:  0-14.9   Medium: 15-21.9   High: 22-29.9   Extreme: 30 and above       No flowsheet data found.

## 2019-02-21 NOTE — Progress Notes (Signed)
Patient discharged to home w/ son. Given all belongings, instructions, equipment. Cam boot and wrist splint in place. Wheelchair with patient. Patient's son verbalized understanding of all instructions. Escorted to pov via w/c.

## 2019-02-21 NOTE — Progress Notes (Signed)
OT Cancellation Note  Patient Details Name: Whitney Ray MRN: [id number] DOB: 26-Sep-1941   Cancelled Treatment:     Patient nurse stated that patient was up all night and was agitated and asked to hold ot if patient was sleeping. Patient was awake but sleepy so deferred OT evaluation in AM.  Ustin Cruickshank 02/21/2019, 9:33 AM

## 2019-02-21 NOTE — TOC Transition Note (Signed)
Transition of Care [facility]) - CM/SW Discharge Note   Patient Details  Name: Whitney Ray MRN: [id number] Date of Birth: [birth year]  Transition of Care [facility]) CM/SW Contact:  Dessa Phi, RN Phone Number: 02/21/2019, 11:29 AM   Clinical Narrative:       Final next level of care: Home w Home Health Services Barriers to Discharge: No Barriers Identified   Patient Goals and CMS Choice     Choice offered to / list presented to : Adult Children  Discharge Placement                       Discharge Plan and Services                        Social Determinants of Health (SDOH) Interventions     Readmission Risk Interventions No flowsheet data found.

## 2019-02-21 NOTE — Progress Notes (Signed)
Paged on call provider RE pt agitation and her being unsafe. Awaiting new orders

## 2019-02-21 NOTE — Care Management CC44 (Signed)
Condition Code 44 Documentation Completed  Patient Details  Name: Whitney Ray MRN: [id number] Date of Birth: 07/15/1941   Condition Code 44 given:  Yes Patient signature on Condition Code 44 notice:  Yes Documentation of 2 MD's agreement:  Yes Code 44 added to claim:  Yes    Dessa Phi, RN 02/21/2019, 11:16 AM

## 2019-02-21 NOTE — TOC Transition Note (Signed)
Transition of Care [facility]) - CM/SW Discharge Note   Patient Details  Name: Whitney Ray MRN: [id number] Date of Birth: [birth year]  Transition of Care [facility]) CM/SW Contact:  Dessa Phi, RN Phone Number: 02/21/2019, 11:32 AM  AHHchosen for HHPT.sSon Shel[place] contact person [phone].[facility] rrp Jason aware of dc.Son will transport home.No further CM needs. Clinical Narrative:       Final next level of care: Home w Home Health Services Barriers to Discharge: No Barriers Identified   Patient Goals and CMS Choice     Choice offered to / list presented to : Adult Children  Discharge Placement                       Discharge Plan and Services                        Social Determinants of Health (SDOH) Interventions     Readmission Risk Interventions No flowsheet data found.

## 2019-02-21 NOTE — TOC Progression Note (Signed)
Transition of Care [facility]) - Progression Note    Patient Details  Name: Whitney Ray MRN: [id number] Date of Birth: 1940/12/26  Transition of Care [facility]) CM/SW Contact  Tajai Suder, Juliann Pulse, RN Phone Number: 02/21/2019, 11:22 AM  Clinical Narrative:         Barriers to Discharge: No Barriers Identified  Expected Discharge Plan and Services         Expected Discharge Date: 02/22/19                         Social Determinants of Health (SDOH) Interventions    Readmission Risk Interventions 30 Day Unplanned Readmission Risk Score     ED to Hosp-Admission (Current) from 02/20/2019 in [facility]  30 Day Unplanned Readmission Risk Score (%)  10 Filed at 02/21/2019 0801     This score is the patient's risk of an unplanned readmission within 30 days of being discharged (0 -100%). The score is based on dignosis, age, lab data, medications, orders, and past utilization.   Low:  0-14.9   Medium: 15-21.9   High: 22-29.9   Extreme: 30 and above       No flowsheet data found.

## 2019-02-21 NOTE — Care Management Obs Status (Signed)
[place] NOTIFICATION   Patient Details  Name: Whitney Ray MRN: [id number] Date of Birth: 1941/10/05   Medicare Observation Status Notification Given:  Yes    MahabirJuliann Pulse, RN 02/21/2019, 11:07 AM

## 2019-02-21 NOTE — Plan of Care (Signed)
Patient suffers from L metatarsal and wrist fracture which impairs their ability to perform daily activities like dressing in the home.  A walker will not resolve  issue with performing activities of daily living. A wheelchair will allow patient to safely perform daily activities. Patient can safely propel the wheelchair in the home or has a caregiver who can provide assistance.  Accessories: elevating leg rests (ELRs), wheel locks, extensions and anti-tippers.

## 2019-02-21 NOTE — Progress Notes (Signed)
Provider on call made aware of pt agitation and that patient is unsafe in room d/t not following commands and continually attempting to get out of bed, requested safety sitter. Provider requested RN give ativan. Ativan given without relief.

## 2019-02-21 NOTE — TOC Transition Note (Signed)
Transition of Care [facility]) - CM/SW Discharge Note   Patient Details  Name: Whitney Ray MRN: [id number] Date of Birth: 11-30-1941  Transition of Care [facility]) CM/SW Contact:  Dessa Phi, RN Phone Number: 02/21/2019, 12:53 PM   Clinical Narrative:   Patient orderd for [place], & w/c-[facility] chosen for [facility] aware. [employer] rep aware of w/c order-to deliver to rm prior d/c. No further CM needs.    Final next level of care: [place] Barriers to Discharge: No Barriers Identified   Patient Goals and CMS Choice     Choice offered to / list presented to : Adult Children  Discharge Placement                       Discharge Plan and Services                        Social Determinants of Health (SDOH) Interventions     Readmission Risk Interventions No flowsheet data found.

## 2019-04-18 ENCOUNTER — Emergency Department (HOSPITAL_COMMUNITY): Payer: Medicare Other

## 2019-04-18 ENCOUNTER — Encounter (HOSPITAL_COMMUNITY): Payer: Self-pay | Admitting: Emergency Medicine

## 2019-04-18 ENCOUNTER — Emergency Department (HOSPITAL_COMMUNITY)
Admission: EM | Admit: 2019-04-18 | Discharge: 2019-04-18 | Disposition: A | Discharge status: Home / Self Care | Payer: Medicare Other | Attending: Emergency Medicine | Admitting: Emergency Medicine

## 2019-04-18 ENCOUNTER — Other Ambulatory Visit: Payer: Self-pay

## 2019-04-18 DIAGNOSIS — F0390 Unspecified dementia without behavioral disturbance: Secondary | ICD-10-CM | POA: Diagnosis not present

## 2019-04-18 DIAGNOSIS — D329 Benign neoplasm of meninges, unspecified: Secondary | ICD-10-CM

## 2019-04-18 DIAGNOSIS — I10 Essential (primary) hypertension: Secondary | ICD-10-CM | POA: Insufficient documentation

## 2019-04-18 DIAGNOSIS — R42 Dizziness and giddiness: Secondary | ICD-10-CM | POA: Diagnosis present

## 2019-04-18 DIAGNOSIS — Z79899 Other long term (current) drug therapy: Secondary | ICD-10-CM | POA: Diagnosis not present

## 2019-04-18 LAB — COMPREHENSIVE METABOLIC PANEL
ALT: 16 U/L (ref 0–44)
AST: 19 U/L (ref 15–41)
Albumin: 4.1 g/dL (ref 3.5–5.0)
Alkaline Phosphatase: 58 U/L (ref 38–126)
Anion gap: 10 (ref 5–15)
BUN: 25 mg/dL — ABNORMAL HIGH (ref 8–23)
CO2: 27 mmol/L (ref 22–32)
Calcium: 9 mg/dL (ref 8.9–10.3)
Chloride: 106 mmol/L (ref 98–111)
Creatinine, Ser: 0.91 mg/dL (ref 0.44–1.00)
GFR calc Af Amer: >60 mL/min (ref >60)
GFR calc non Af Amer: >60 mL/min (ref >60)
Glucose, Bld: 114 mg/dL — ABNORMAL HIGH (ref 70–99)
Potassium: 3.2 mmol/L — ABNORMAL LOW (ref 3.5–5.1)
Sodium: 143 mmol/L (ref 135–145)
Total Bilirubin: 0.6 mg/dL (ref 0.3–1.2)
Total Protein: 6.6 g/dL (ref 6.5–8.1)

## 2019-04-18 LAB — URINALYSIS, ROUTINE W REFLEX MICROSCOPIC
Bilirubin Urine: NEGATIVE
Glucose, UA: NEGATIVE mg/dL
Hgb urine dipstick: NEGATIVE
Ketones, ur: NEGATIVE mg/dL
Leukocytes,Ua: NEGATIVE
Nitrite: NEGATIVE
Protein, ur: NEGATIVE mg/dL
Specific Gravity, Urine: 1.02 (ref 1.005–1.030)
pH: 6 (ref 5.0–8.0)

## 2019-04-18 LAB — CBC WITH DIFFERENTIAL/PLATELET
Abs Immature Granulocytes: 0.03 10*3/uL (ref 0.00–0.07)
Basophils Absolute: 0.1 10*3/uL (ref 0.0–0.1)
Basophils Relative: 1 %
Eosinophils Absolute: 0.2 10*3/uL (ref 0.0–0.5)
Eosinophils Relative: 2 %
HCT: 41.1 % (ref 36.0–46.0)
Hemoglobin: 13.2 g/dL (ref 12.0–15.0)
Immature Granulocytes: 0 %
Lymphocytes Relative: 17 %
Lymphs Abs: 1.3 10*3/uL (ref 0.7–4.0)
MCH: 29.5 pg (ref 26.0–34.0)
MCHC: 32.1 g/dL (ref 30.0–36.0)
MCV: 91.9 fL (ref 80.0–100.0)
Monocytes Absolute: 0.6 10*3/uL (ref 0.1–1.0)
Monocytes Relative: 8 %
Neutro Abs: 5.8 10*3/uL (ref 1.7–7.7)
Neutrophils Relative %: 72 %
Platelets: 174 10*3/uL (ref 150–400)
RBC: 4.47 MIL/uL (ref 3.87–5.11)
RDW: 14.5 % (ref 11.5–15.5)
WBC: 8 10*3/uL (ref 4.0–10.5)
nRBC: 0 % (ref 0.0–0.2)

## 2019-04-18 MED ORDER — MECLIZINE HCL 12.5 MG PO TABS: 12.5000 mg | ORAL_TABLET | Freq: Three times a day (TID) | ORAL | 0 refills | Status: DC | PRN

## 2019-04-18 MED ORDER — GADOBUTROL 1 MMOL/ML IV SOLN
7.0000 mL | Freq: Once | INTRAVENOUS | Status: AC | PRN
  Administered 2019-04-18: 10:00:00 7 mL via INTRAVENOUS

## 2019-04-18 MED ORDER — LORAZEPAM 2 MG/ML IJ SOLN
1.0000 mg | Freq: Once | INTRAMUSCULAR | Status: AC
  Administered 2019-04-18: 1 mg via INTRAVENOUS
  Filled 2019-04-18: qty 1

## 2019-04-18 NOTE — ED Notes (Signed)
Pt hitting and striking with feet.    Informed RN.

## 2019-04-18 NOTE — ED Notes (Signed)
Patient transported to MRI 

## 2019-04-18 NOTE — ED Provider Notes (Signed)
TIME SEEN: 5:45 AM  CHIEF COMPLAINT: Dizziness, unsteady gait  HPI: Patient is a 78 year old female with history of severe dementia, hypertension, mitral valve prolapse, meningioma status post Gamma Knife stereotactic radiosurgery on 05/07/2018 with Dr Salomon Fick at [facility] for right posterior fossa tentorial meningioma who presents to the emergency department with her son for concerns of loss of balance and concerns for possible worsening meningioma.  Patient son provides all of the history as patient has severe dementia.  Patient is not sure why she is here today.  Son states that she was in her normal state of health yesterday.  Reports that she lives at home with her husband who also has severe dementia and they have a 24/7 caregiver.  States the patient woke up tonight groaning and the caregiver was concerned and called the patient's son.  When patient arrived, patient told her son that "I feel like I am dying".  When son pushed the mother further from her information he states the patient told her "I feel like I am going to fall".  He states it took himself and the caregiver to help get her to the bathroom.  She seemed very unsteady on her feet which is not normal for her.  He denies any known head injuries although she did have a fall that was unwitnessed in March that required admission to the hospital.  He is concerned now that this fall could have been related to this meningioma.  She last had any imaging for the meningioma in November 2019 which showed an increase in size and states at that time his mother was put on steroids to help with "swelling".  They have not seen the neurosurgeon since.  Initial MRI of her brain that found this meningioma was done in 2017 and was done for memory loss rather than gait disturbances.  Son reports that her neurosurgeon told them that if this meningioma got bigger that the patient would have a hard time walking and difficulty with balance.  Patient denies to me that she  is having any pain currently.  No chest pain.  She denies shortness of breath.  No known recent fevers, cough, vomiting or diarrhea.  She denies numbness on exam.  Nysia Dell - [phone]  ROS: Level 5 caveat secondary to dementia  PAST MEDICAL HISTORY/PAST SURGICAL HISTORY:  Past Medical History:  Diagnosis Date  . Allergic rhinitis   . Dementia ([place])   . History of brain surgery   . Hypertension   . Metabolic syndrome    elevated bs, elevated cholesterol obese  . Mitral valve prolapse   . PMS (premenstrual syndrome)   . Sun-damaged skin    chronic  . Urinary incontinence     MEDICATIONS:  Prior to Admission medications   Medication Sig Start Date End Date Taking? Authorizing Provider  donepezil (ARICEPT) 10 MG tablet Take 10 mg by mouth at bedtime.  01/20/19   [provider]  pravastatin (PRAVACHOL) 20 MG tablet Take 20 mg by mouth daily.    [provider]  QUEtiapine (SEROQUEL) 25 MG tablet Take 25 mg by mouth at bedtime.  02/03/19   [provider]  traMADol (ULTRAM) 50 MG tablet Take 1 tablet (50 mg total) by mouth every 6 (six) hours as needed for moderate pain (Would be very judicious with sedating medication given patient's fall, dementia). 02/21/19   Donne Hazel, MD    ALLERGIES:  Allergies  Allergen Reactions  . Penicillins     *Family  and Patient don't remember that she is allergic to this* REACTION: hives Has patient had a PCN reaction causing immediate rash, facial/tongue/throat swelling, SOB or lightheadedness with hypotension: Unknown Has patient had a PCN reaction causing severe rash involving mucus membranes or skin necrosis: Unknown Has patient had a PCN reaction that required hospitalization: Unknown Has patient had a PCN reaction occurring within the last 10 years: Unknown If all of the above answers are "NO",     SOCIAL HISTORY:  Social History   Tobacco Use  . Smoking status: Never Smoker  . Smokeless tobacco:  Never Used  Substance Use Topics  . Alcohol use: No    FAMILY HISTORY: History reviewed. No pertinent family history.  EXAM: BP (!) 178/73 (BP Location: Right Arm)   Pulse 73   Temp 98.4 F (36.9 C) (Oral)   Resp 14   Ht 5\' 6"  (1.676 m)   Wt 81.6 kg   SpO2 98%   BMI 29.05 kg/m  CONSTITUTIONAL: Alert and oriented to person only.  She is severely demented but pleasant and in no distress. HEAD: Normocephalic and atraumatic EYES: Conjunctivae clear, pupils appear equal, EOMI ENT: normal nose; moist mucous membranes NECK: Supple, no meningismus, no nuchal rigidity, no LAD  CARD: RRR; S1 and S2 appreciated; no murmurs, no clicks, no rubs, no gallops RESP: Normal chest excursion without splinting or tachypnea; breath sounds clear and equal bilaterally; no wheezes, no rhonchi, no rales, no hypoxia or respiratory distress, speaking full sentences ABD/GI: Normal bowel sounds; non-distended; soft, non-tender, no rebound, no guarding, no peritoneal signs, no hepatosplenomegaly BACK:  The back appears normal and is non-tender to palpation, there is no CVA tenderness EXT: Normal ROM in all joints; non-tender to palpation; no edema; normal capillary refill; no cyanosis, no calf tenderness or swelling    SKIN: Normal color for age and race; warm; no rash NEURO: Moves all extremities equally, she reports sensation to light touch is intact diffusely, strength 5/5 in all 4 extremities, cranial nerves II through XII intact, she does have a difficult time following many commands, she is able to ambulate and does so with a 1 person assist with no ataxia but does require assistance ambulating PSYCH: The patient's mood and manner are appropriate. Grooming and personal hygiene are appropriate.  MEDICAL DECISION MAKING: Patient here with concerns for possible worsening meningioma.  Son reports that she seemed unsteady on her feet tonight and had a fall in March and he is concerned that her meningioma is  worsening and would like head imaging.  We did discuss goals of treatment and he states that he feels that he would want treatment to improve patient's quality of life if needed including gamma knife surgery, steroids.  He also agrees to checking labs today to ensure no anemia, electrolyte derangement, UTI causing her symptoms today.  We will also check an EKG.  She is hemodynamically stable here without obvious focal neurologic deficit.  I have low suspicion for stroke currently.  Will obtain an MRI of her brain with and without contrast to evaluate this meningioma and see if there is any sign of acute worsening today that could contribute to any symptoms she is currently having.  Son is comfortable with this plan.  ED PROGRESS: Patient's labs are reassuring.  Urinalysis, MRI of the brain with and without contrast pending.  Signed out to oncoming ED physician to follow-up on these results and reassess patient.   I reviewed all nursing notes, vitals, pertinent previous records,  EKGs, lab and urine results, imaging (as available).      EKG Interpretation  Date/Time:  Sunday Apr 18 2019 06:04:28 EDT Ventricular Rate:  73 PR Interval:    QRS Duration: 91 QT Interval:  493 QTC Calculation: 544 R Axis:   58 Text Interpretation:  Sinus arrhythmia Borderline T wave abnormalities Prolonged QT interval No significant change since last tracing Confirmed by Rainna Nearhood, Cyril Mourning [phone]) on 04/18/2019 6:07:42 AM          Tenia Goh, Delice Bison, DO 04/18/19 [id number]

## 2019-04-18 NOTE — ED Notes (Signed)
Pt more agitated, hitting at staff,  Dr Tomi Bamberger aware, at present bil wrist restraints have been ordered and applied to pts wrist for safety

## 2019-04-18 NOTE — Discharge Instructions (Signed)
try taking the meclizine to see if it helps with the dizziness, follow-up with your neurologist as planned

## 2019-04-18 NOTE — ED Provider Notes (Signed)
Patient's MRI results have returned.  Her known meningioma appears unchanged in size.  There is no evidence of any edema or mass-effect.  Evidence of stroke  I discussed the findings with the patient's son.  There is no evidence of any emergency medical condition today fortunately.  Is possible her meningioma could be causing some balance issues for her.  She could also have a component of mild vertigo.  I have her try a low-dose of meclizine but explained to the son that if this is not helpful or she starts having side effects to discontinue.  She plans to follow-up with her doctors this week.   Dorie Rank, MD 04/18/19 [phone]

## 2019-04-18 NOTE — ED Notes (Signed)
Pt was placed on a purewick.

## 2019-04-18 NOTE — ED Triage Notes (Signed)
Pt presents from home by son for complaint of dizziness and losing balance. Pt confused to time and place.

## 2019-04-18 NOTE — ED Notes (Signed)
Pt has returned from MRI, agitated trying to get out of bed and keeps pulling mittens off hands, sitter at bedside.

## 2019-04-18 NOTE — ED Notes (Signed)
Spoke with Karlton Lemon (pts son) who will come to transport her home. I have reviewed instructions with him due to pts confusion. He verbalizes understanding and will pick her up in front of the ED.

## 2020-01-02 ENCOUNTER — Emergency Department (HOSPITAL_COMMUNITY)
Admission: EM | Admit: 2020-01-02 | Discharge: 2020-01-02 | Disposition: A | Discharge status: Home / Self Care | Payer: Medicare PPO | Attending: Emergency Medicine | Admitting: Emergency Medicine

## 2020-01-02 ENCOUNTER — Encounter (HOSPITAL_COMMUNITY): Payer: Self-pay | Admitting: *Deleted

## 2020-01-02 ENCOUNTER — Other Ambulatory Visit: Payer: Self-pay

## 2020-01-02 ENCOUNTER — Emergency Department (HOSPITAL_COMMUNITY): Payer: Medicare PPO

## 2020-01-02 DIAGNOSIS — I10 Essential (primary) hypertension: Secondary | ICD-10-CM | POA: Diagnosis not present

## 2020-01-02 DIAGNOSIS — S8001XA Contusion of right knee, initial encounter: Secondary | ICD-10-CM | POA: Diagnosis not present

## 2020-01-02 DIAGNOSIS — F0390 Unspecified dementia without behavioral disturbance: Secondary | ICD-10-CM | POA: Diagnosis not present

## 2020-01-02 DIAGNOSIS — S0081XA Abrasion of other part of head, initial encounter: Secondary | ICD-10-CM | POA: Insufficient documentation

## 2020-01-02 DIAGNOSIS — W109XXA Fall (on) (from) unspecified stairs and steps, initial encounter: Secondary | ICD-10-CM | POA: Insufficient documentation

## 2020-01-02 DIAGNOSIS — Y939 Activity, unspecified: Secondary | ICD-10-CM | POA: Insufficient documentation

## 2020-01-02 DIAGNOSIS — Y92019 Unspecified place in single-family (private) house as the place of occurrence of the external cause: Secondary | ICD-10-CM | POA: Diagnosis not present

## 2020-01-02 DIAGNOSIS — W19XXXA Unspecified fall, initial encounter: Secondary | ICD-10-CM

## 2020-01-02 DIAGNOSIS — S62102A Fracture of unspecified carpal bone, left wrist, initial encounter for closed fracture: Secondary | ICD-10-CM | POA: Insufficient documentation

## 2020-01-02 DIAGNOSIS — Z79899 Other long term (current) drug therapy: Secondary | ICD-10-CM | POA: Diagnosis not present

## 2020-01-02 DIAGNOSIS — Z23 Encounter for immunization: Secondary | ICD-10-CM | POA: Insufficient documentation

## 2020-01-02 DIAGNOSIS — Y999 Unspecified external cause status: Secondary | ICD-10-CM | POA: Insufficient documentation

## 2020-01-02 DIAGNOSIS — S6992XA Unspecified injury of left wrist, hand and finger(s), initial encounter: Secondary | ICD-10-CM | POA: Diagnosis present

## 2020-01-02 MED ORDER — TETANUS-DIPHTH-ACELL PERTUSSIS 5-2.5-18.5 LF-MCG/0.5 IM SUSP
0.5000 mL | Freq: Once | INTRAMUSCULAR | Status: AC
  Administered 2020-01-02: 0.5 mL via INTRAMUSCULAR
  Filled 2020-01-02: qty 0.5

## 2020-01-02 MED ORDER — TRAMADOL HCL 50 MG PO TABS: 50.0000 mg | ORAL_TABLET | Freq: Four times a day (QID) | ORAL | 0 refills | Status: DC | PRN

## 2020-01-02 MED ORDER — TRAMADOL HCL 50 MG PO TABS
50.0000 mg | ORAL_TABLET | Freq: Once | ORAL | Status: AC
  Administered 2020-01-02: 50 mg via ORAL
  Filled 2020-01-02: qty 1

## 2020-01-02 MED ORDER — LIDOCAINE-EPINEPHRINE (PF) 2 %-1:200000 IJ SOLN
20.0000 mL | Freq: Once | INTRAMUSCULAR | Status: AC
  Administered 2020-01-02: 20 mL
  Filled 2020-01-02: qty 20

## 2020-01-02 NOTE — ED Provider Notes (Signed)
[facility] DEPT Provider Note   CSN: [id number] Arrival date & time: 01/02/20  [id number]     History Chief Complaint  Patient presents with  . Fall  . Weakness    Whitney Ray is a 79 y.o. female. Level 5 caveat secondary to dementia. Per EMS patient fell at home down some steps unclear how many. Family members told EMS that the patient is very restless at night and has a walker. Her PCP increased her sleeping medication just last week. Patient herself cannot explain why she fell. She is complaining of some left wrist pain.  The history is provided by the patient and the EMS personnel.  Fall This is a new problem. The current episode started 1 to 2 hours ago. The problem has not changed since onset.Pertinent negatives include no chest pain, no abdominal pain and no shortness of breath. Associated symptoms comments: l wrist pain. The symptoms are aggravated by bending and twisting. Nothing relieves the symptoms. She has tried nothing for the symptoms. The treatment provided no relief.       Past Medical History:  Diagnosis Date  . Allergic rhinitis   . Dementia ([place])   . History of brain surgery   . Hypertension   . Metabolic syndrome    elevated bs, elevated cholesterol obese  . Mitral valve prolapse   . PMS (premenstrual syndrome)   . Sun-damaged skin    chronic  . Urinary incontinence     Patient Active Problem List   Diagnosis Date Noted  . Fall 02/21/2019  . Wrist fracture 02/20/2019  . Accidental fall 02/20/2019  . Other physeal fracture of left metatarsal, initial encounter for closed fracture 02/20/2019  . Leukocytosis 02/20/2019  . Vertigo 11/29/2014  . Dysplastic nevi 09/26/2011  . SOLAR KERATOSIS 08/31/2009  . MIXED HEARING LOSS BILATERAL 08/16/2008  . METABOLIC SYNDROME X [id number]  . Essential hypertension 08/07/2007  . Mitral valve disease 08/07/2007  . ALLERGIC RHINITIS 08/07/2007  . DERMATITIS, CNTCT, ACUTE D/T SOLAR  RADIATION 08/07/2007  . URINARY INCONTINENCE 08/07/2007    Past Surgical History:  Procedure Laterality Date  . BRAIN SURGERY       OB History   No obstetric history on file.     History reviewed. No pertinent family history.  Social History   Tobacco Use  . Smoking status: Never Smoker  . Smokeless tobacco: Never Used  Substance Use Topics  . Alcohol use: No  . Drug use: No    Home Medications Prior to Admission medications   Medication Sig Start Date End Date Taking? Authorizing Provider  donepezil (ARICEPT) 10 MG tablet Take 10 mg by mouth at bedtime.  01/20/19   [provider]  HYDROcodone-acetaminophen (NORCO/VICODIN) 5-325 MG tablet Take 1 tablet by mouth every 6 (six) hours as needed for moderate pain.  02/22/19   [provider]  meclizine (ANTIVERT) 12.5 MG tablet Take 1 tablet (12.5 mg total) by mouth 3 (three) times daily as needed for dizziness. 04/18/19   Dorie Rank, MD  pravastatin (PRAVACHOL) 20 MG tablet Take 20 mg by mouth daily.    [provider]  QUEtiapine (SEROQUEL) 25 MG tablet Take 25 mg by mouth at bedtime.  02/03/19   [provider]  traMADol (ULTRAM) 50 MG tablet Take 1 tablet (50 mg total) by mouth every 6 (six) hours as needed for moderate pain (Would be very judicious with sedating medication given patient's fall, dementia). 02/21/19   Donne Hazel, MD  Allergies    Penicillins  Review of Systems   Review of Systems  Constitutional: Negative for fever.  HENT: Negative for sore throat.   Eyes: Negative for visual disturbance.  Respiratory: Negative for shortness of breath.   Cardiovascular: Negative for chest pain.  Gastrointestinal: Negative for abdominal pain.  Genitourinary: Negative for dysuria.  Musculoskeletal: Negative for neck pain.  Skin: Positive for wound. Negative for rash.  Neurological: Negative for weakness and numbness.    Physical Exam Updated Vital Signs BP (!) 164/85 (BP  Location: Right Arm)   Pulse 77   Temp 98.4 F (36.9 C) (Oral)   Resp 19   Wt 77.1 kg   SpO2 97%   BMI 27.44 kg/m   Physical Exam Vitals and nursing note reviewed.  Constitutional:      General: She is not in acute distress.    Appearance: She is well-developed.  HENT:     Head: Normocephalic.     Comments: She has abrasions to her lower lip and chin. Able to open mouth without any difficulty. No intraoral bleeding noted.    Nose: Nose normal.  Eyes:     Conjunctiva/sclera: Conjunctivae normal.  Cardiovascular:     Rate and Rhythm: Normal rate and regular rhythm.     Heart sounds: No murmur.  Pulmonary:     Effort: Pulmonary effort is normal. No respiratory distress.     Breath sounds: Normal breath sounds.  Abdominal:     Palpations: Abdomen is soft.     Tenderness: There is no abdominal tenderness.  Musculoskeletal:        General: Tenderness and deformity present.     Cervical back: Neck supple.     Comments: Right upper extremity full range of motion without any pain or limitations. Left upper extremity deformity at wrist. Painful. Elbow and shoulder nontender. Distal neurovascular intact. Right lower extremity abrasions over knee and some tenderness although full range of motion. Left lower extremity full range of motion without any pain or limitations. Cervical spine no midline tenderness.  Skin:    General: Skin is warm and dry.     Capillary Refill: Capillary refill takes less than 2 seconds.  Neurological:     General: No focal deficit present.     Mental Status: She is alert. Mental status is at baseline. She is disoriented.     Sensory: No sensory deficit.     Motor: No weakness.     Comments: Oriented to situation and place. She thought it was 7 PM at night.     ED Results / Procedures / Treatments   Labs (all labs ordered are listed, but only abnormal results are displayed) Labs Reviewed - No data to display  EKG None  Radiology DG Chest 1  View  Result Date: 01/02/2020 CLINICAL DATA:  Fall EXAM: CHEST  1 VIEW COMPARISON:  07/15/2018 FINDINGS: The heart size and mediastinal contours are within normal limits. Both lungs are clear. The visualized skeletal structures are unremarkable. IMPRESSION: No active disease. Electronically Signed   By: Franchot Gallo M.D.   On: 01/02/2020 08:11   DG Wrist 2 Views Left  Result Date: 01/02/2020 CLINICAL DATA:  Fall. EXAM: LEFT WRIST - 2 VIEW COMPARISON:  Plain film of the LEFT wrist from earlier same day. FINDINGS: Interval casting. Osseous alignment is stable. IMPRESSION: Stable osseous alignment status post casting. Electronically Signed   By: Franki Cabot M.D.   On: 01/02/2020 12:20   DG Wrist Complete Left  Result Date: 01/02/2020 CLINICAL DATA:  Fall. EXAM: LEFT WRIST - COMPLETE 3+ VIEW COMPARISON:  02/20/2019 FINDINGS: Acute fracture distal radius and ulna. Prior studies also demonstrated a fracture of the distal radius however there are new acute fractures. Dorsal displacement of distal radial fracture. Mild displacement of ulnar styloid fracture. Radiocarpal joint normal. IMPRESSION: Acute fracture distal radius and ulna. Dorsal displacement of radial fracture. Electronically Signed   By: Franchot Gallo M.D.   On: 01/02/2020 08:10   CT Head Wo Contrast  Result Date: 01/02/2020 CLINICAL DATA:  Fall down steps, headache, neck pain EXAM: CT HEAD WITHOUT CONTRAST CT CERVICAL SPINE WITHOUT CONTRAST TECHNIQUE: Multidetector CT imaging of the head and cervical spine was performed following the standard protocol without intravenous contrast. Multiplanar CT image reconstructions of the cervical spine were also generated. COMPARISON:  MRI brain dated 04/18/2019 FINDINGS: CT HEAD FINDINGS Brain: 1.4 x 2.2 cm right posterior fossa extra-axial mass (series 3/image 10), corresponding to the patient's known hemangioma, stable versus mildly decreased. No evidence of acute infarction, hemorrhage, hydrocephalus,  extra-axial collection or mass effect. Mild global cortical and central atrophy. Subcortical white matter and periventricular small vessel ischemic changes. Vascular: No hyperdense vessel or unexpected calcification. Skull: Normal. Negative for fracture or focal lesion. Sinuses/Orbits: The visualized paranasal sinuses are essentially clear. The mastoid air cells are unopacified. Other: None. CT CERVICAL SPINE FINDINGS Alignment: Normal cervical lordosis. Skull base and vertebrae: No acute fracture. No primary bone lesion or focal pathologic process. Soft tissues and spinal canal: No prevertebral fluid or swelling. No visible canal hematoma. Disc levels: Mild multilevel degenerative changes of the mid/lower cervical spine. Spinal canal is patent. Upper chest: Visualized lung apices are notable for biapical pleural-parenchymal scarring. Other: Visualized thyroid is unremarkable. IMPRESSION: No evidence of acute intracranial abnormality. Atrophy with small vessel ischemic changes. 2.2 cm right posterior fossa meningioma, stable versus mildly decreased. No evidence of traumatic injury to the cervical spine. Mild multilevel degenerative changes. Electronically Signed   By: Julian Hy M.D.   On: 01/02/2020 07:33   CT Cervical Spine Wo Contrast  Result Date: 01/02/2020 CLINICAL DATA:  Fall down steps, headache, neck pain EXAM: CT HEAD WITHOUT CONTRAST CT CERVICAL SPINE WITHOUT CONTRAST TECHNIQUE: Multidetector CT imaging of the head and cervical spine was performed following the standard protocol without intravenous contrast. Multiplanar CT image reconstructions of the cervical spine were also generated. COMPARISON:  MRI brain dated 04/18/2019 FINDINGS: CT HEAD FINDINGS Brain: 1.4 x 2.2 cm right posterior fossa extra-axial mass (series 3/image 10), corresponding to the patient's known hemangioma, stable versus mildly decreased. No evidence of acute infarction, hemorrhage, hydrocephalus, extra-axial collection or  mass effect. Mild global cortical and central atrophy. Subcortical white matter and periventricular small vessel ischemic changes. Vascular: No hyperdense vessel or unexpected calcification. Skull: Normal. Negative for fracture or focal lesion. Sinuses/Orbits: The visualized paranasal sinuses are essentially clear. The mastoid air cells are unopacified. Other: None. CT CERVICAL SPINE FINDINGS Alignment: Normal cervical lordosis. Skull base and vertebrae: No acute fracture. No primary bone lesion or focal pathologic process. Soft tissues and spinal canal: No prevertebral fluid or swelling. No visible canal hematoma. Disc levels: Mild multilevel degenerative changes of the mid/lower cervical spine. Spinal canal is patent. Upper chest: Visualized lung apices are notable for biapical pleural-parenchymal scarring. Other: Visualized thyroid is unremarkable. IMPRESSION: No evidence of acute intracranial abnormality. Atrophy with small vessel ischemic changes. 2.2 cm right posterior fossa meningioma, stable versus mildly decreased. No evidence of traumatic injury to the  cervical spine. Mild multilevel degenerative changes. Electronically Signed   By: Julian Hy M.D.   On: 01/02/2020 07:33   DG Knee Complete 4 Views Right  Result Date: 01/02/2020 CLINICAL DATA:  Fall.  Pain. EXAM: RIGHT KNEE - COMPLETE 4+ VIEW COMPARISON:  No comparison studies available. FINDINGS: No fracture. No subluxation or dislocation. Loss of joint space noted medial compartment. Hypertrophic spurring is visible in all 3 compartments, most advanced laterally. No substantial joint effusion. IMPRESSION: Degenerative changes without acute bony abnormality. Electronically Signed   By: Misty Stanley M.D.   On: 01/02/2020 08:11    Procedures .Ortho Injury Treatment  Date/Time: 01/02/2020 11:56 AM Performed by: Hayden Rasmussen, MD Authorized by: Hayden Rasmussen, MD   Consent:    Consent obtained:  Verbal   Consent given by:  Patient  and healthcare agent (son)   Risks discussed:  Fracture, restricted joint movement, vascular damage and nerve damage   Alternatives discussed:  No treatment and delayed treatmentInjury location: wrist Location details: left wrist Injury type: fracture Fracture type: distal radius and ulnar styloid Pre-procedure neurovascular assessment: neurovascularly intact Pre-procedure distal perfusion: normal Pre-procedure neurological function: normal Pre-procedure range of motion: reduced Anesthesia: hematoma block  Anesthesia: Local anesthesia used: yes Local Anesthetic: lidocaine 2% with epinephrine Anesthetic total: 7 mL  Patient sedated: NoManipulation performed: yes Skin traction used: no Skeletal traction used: no X-ray confirmed reduction: yes Immobilization: splint Splint type: sugar tong Supplies used: cotton padding and Ortho-Glass Post-procedure neurovascular assessment: post-procedure neurovascularly intact Post-procedure distal perfusion: normal Post-procedure neurological function: normal Post-procedure range of motion: unchanged Patient tolerance: patient tolerated the procedure well with no immediate complications    (including critical care time)  Medications Ordered in ED Medications  Tdap (BOOSTRIX) injection 0.5 mL (0.5 mLs Intramuscular Given 01/02/20 0811)  lidocaine-EPINEPHrine (XYLOCAINE W/EPI) 2 %-1:200000 (PF) injection 20 mL (20 mLs Infiltration Given by Other 01/02/20 0902)  traMADol (ULTRAM) tablet 50 mg (50 mg Oral Given 01/02/20 1117)    ED Course  I have reviewed the triage vital signs and the nursing notes.  Pertinent labs & imaging results that were available during my care of the patient were reviewed by me and considered in my medical decision making (see chart for details).  Clinical Course as of Jan 01 1701  Sun Jan 01, 4971  617 79 year old female status post fall here with likely broken wrist and signs of facial trauma.  Getting CT head and  cervical spine.  X-rays of wrist right knee chest.  Differential includes pneumothorax, fractures, intracranial bleed, skull fracture, contusions, abrasions.  Tetanus ordered.   [MB]  [id number] Just last week she had a meningioma resection at [facility].   [MB]  [phone] Head CT and cervical CT do not show any acute traumatic findings.   [MB]  [id number] Chest x-ray showing no gross pneumothorax.  Left wrist shows comminuted distal radius and ulnar fracture. Right knee showing no gross fractures.  Awaiting radiology reading.   [MB]  [phone] Patient states she does not use a cane or a walker.   [MB]  [phone] Discussed with the patient's son Cleatus Rozeboom.  He is not sure if she fell on the stairs this was outside, because she was on the sidewalk.  Possibly had made it down the stairs and fell going back towards the house.  He is hoping to have her come home he says they have 24/7 care there.   [MB]  1108 Patient's son is here now.  She  is already taken off her temporary splint once.  He said when she had a splint back in the spring she would not keep it on.  Still waiting on [employer] to help get her into a sugar tong.   [MB]  [id number] Post reduction films do not show any significant improvement.  She is splinted and will follow up outpatient with hand surgery Dr. Apolonio Schneiders   [MB]    Clinical Course User Index [MB] Hayden Rasmussen, MD   MDM Rules/Calculators/A&P                       Final Clinical Impression(s) / ED Diagnoses Final diagnoses:  Closed fracture of left wrist, initial encounter  Abrasion of face, initial encounter  Contusion of right knee, initial encounter  Fall, initial encounter    Rx / DC Orders ED Discharge Orders         Ordered    traMADol (ULTRAM) 50 MG tablet  Every 6 hours PRN,   Status:  Discontinued    Note to Pharmacy: [place] reviewed   01/02/20 1149    traMADol (ULTRAM) 50 MG tablet  Every 6 hours PRN    Note to Pharmacy: [place] reviewed   01/02/20 1200           Hayden Rasmussen, MD 01/02/20 1702

## 2020-01-02 NOTE — Progress Notes (Signed)
Orthopedic Tech Progress Note Patient Details:  Whitney Ray 1941-06-03 [id number]  Ortho Devices Type of Ortho Device: Ace wrap, Arm sling, Sugartong splint Ortho Device/Splint Location: left reduction Ortho Device/Splint Interventions: Application   Post Interventions Patient Tolerated: Well Instructions Provided: Care of device   [place] Pink 01/02/2020, 12:43 PM

## 2020-01-02 NOTE — Discharge Instructions (Addendum)
You were seen in the emergency department for evaluation of injuries from a fall.  You had a CAT scan of your head and cervical spine did not show any acute findings.  Your wrist x-ray showed a fractured wrist on the left.  This was splinted.  You also had an x-ray of your right knee that did not show fracture.  You should use soap and water as needed for your abrasions.  We are prescribing you a short course of some tramadol to help with pain.  This medication may be sedating and may make you unsteady on your feet.  Please contact Dr. Lequita Asal office tomorrow for close follow-up.

## 2020-01-02 NOTE — ED Triage Notes (Signed)
Pt alert x2, from home, came in via EMS. Per her family the pt's fell down the steps at home. Pt's family members stated that her pcp increased the  Pt's sleeping medication. I will continue to monitor.

## 2020-01-02 NOTE — ED Notes (Signed)
Whitney Ray (son) [phone] will come if needed and call with any updates please

## 2020-01-08 ENCOUNTER — Other Ambulatory Visit (HOSPITAL_COMMUNITY)
Admission: RE | Admit: 2020-01-08 | Discharge: 2020-01-08 | Disposition: A | Discharge status: Home / Self Care | Payer: Medicare PPO | Source: Ambulatory Visit | Attending: Orthopedic Surgery | Admitting: Orthopedic Surgery

## 2020-01-08 ENCOUNTER — Encounter (HOSPITAL_COMMUNITY): Payer: Self-pay | Admitting: Orthopedic Surgery

## 2020-01-08 DIAGNOSIS — Z20822 Contact with and (suspected) exposure to covid-19: Secondary | ICD-10-CM | POA: Insufficient documentation

## 2020-01-08 DIAGNOSIS — Z01812 Encounter for preprocedural laboratory examination: Secondary | ICD-10-CM | POA: Diagnosis present

## 2020-01-08 LAB — SARS CORONAVIRUS 2 (TAT 6-24 HRS): SARS Coronavirus 2: NEGATIVE

## 2020-01-08 NOTE — Progress Notes (Signed)
Spoke with son. Son states patient severe dementia, likes to pull at stuff, and is unable to sign consent. I advised son he would be allowed to stay with patient due to condition. Patient going to get COVID test this morning. He will instruct care givers that they need to be wearing a mask while around patient after COVID test. Son verbalized understanding of same.

## 2020-01-10 ENCOUNTER — Ambulatory Visit (HOSPITAL_COMMUNITY): Payer: Medicare PPO | Admitting: Anesthesiology

## 2020-01-10 ENCOUNTER — Ambulatory Visit (HOSPITAL_COMMUNITY)
Admission: AD | Admit: 2020-01-10 | Discharge: 2020-01-10 | Disposition: A | Discharge status: Home / Self Care | Payer: Medicare PPO | Source: Ambulatory Visit | Attending: Orthopedic Surgery | Admitting: Orthopedic Surgery

## 2020-01-10 ENCOUNTER — Encounter (HOSPITAL_COMMUNITY)
Admission: AD | Disposition: A | Discharge status: Home / Self Care | Payer: Self-pay | Source: Ambulatory Visit | Attending: Orthopedic Surgery

## 2020-01-10 ENCOUNTER — Other Ambulatory Visit: Payer: Self-pay

## 2020-01-10 ENCOUNTER — Encounter (HOSPITAL_COMMUNITY): Payer: Self-pay | Admitting: Orthopedic Surgery

## 2020-01-10 DIAGNOSIS — Z6826 Body mass index (BMI) 26.0-26.9, adult: Secondary | ICD-10-CM | POA: Insufficient documentation

## 2020-01-10 DIAGNOSIS — S52572A Other intraarticular fracture of lower end of left radius, initial encounter for closed fracture: Secondary | ICD-10-CM | POA: Diagnosis not present

## 2020-01-10 DIAGNOSIS — I341 Nonrheumatic mitral (valve) prolapse: Secondary | ICD-10-CM | POA: Diagnosis not present

## 2020-01-10 DIAGNOSIS — F0390 Unspecified dementia without behavioral disturbance: Secondary | ICD-10-CM | POA: Insufficient documentation

## 2020-01-10 DIAGNOSIS — W109XXA Fall (on) (from) unspecified stairs and steps, initial encounter: Secondary | ICD-10-CM | POA: Diagnosis not present

## 2020-01-10 DIAGNOSIS — I10 Essential (primary) hypertension: Secondary | ICD-10-CM | POA: Diagnosis not present

## 2020-01-10 DIAGNOSIS — S52502A Unspecified fracture of the lower end of left radius, initial encounter for closed fracture: Secondary | ICD-10-CM

## 2020-01-10 DIAGNOSIS — E669 Obesity, unspecified: Secondary | ICD-10-CM | POA: Insufficient documentation

## 2020-01-10 DIAGNOSIS — Z880 Allergy status to penicillin: Secondary | ICD-10-CM | POA: Insufficient documentation

## 2020-01-10 DIAGNOSIS — S52502D Unspecified fracture of the lower end of left radius, subsequent encounter for closed fracture with routine healing: Secondary | ICD-10-CM

## 2020-01-10 HISTORY — DX: Disorder of kidney and ureter, unspecified: N28.9

## 2020-01-10 HISTORY — PX: OPEN REDUCTION INTERNAL FIXATION (ORIF) DISTAL RADIAL FRACTURE: SHX5989

## 2020-01-10 LAB — CBC
HCT: 48.3 % — ABNORMAL HIGH (ref 36.0–46.0)
Hemoglobin: 15.8 g/dL — ABNORMAL HIGH (ref 12.0–15.0)
MCH: 30 pg (ref 26.0–34.0)
MCHC: 32.7 g/dL (ref 30.0–36.0)
MCV: 91.7 fL (ref 80.0–100.0)
Platelets: 232 10*3/uL (ref 150–400)
RBC: 5.27 MIL/uL — ABNORMAL HIGH (ref 3.87–5.11)
RDW: 14.4 % (ref 11.5–15.5)
WBC: 6.2 10*3/uL (ref 4.0–10.5)
nRBC: 0 % (ref 0.0–0.2)

## 2020-01-10 LAB — BASIC METABOLIC PANEL
Anion gap: 16 — ABNORMAL HIGH (ref 5–15)
BUN: 15 mg/dL (ref 8–23)
CO2: 23 mmol/L (ref 22–32)
Calcium: 9.3 mg/dL (ref 8.9–10.3)
Chloride: 105 mmol/L (ref 98–111)
Creatinine, Ser: 0.93 mg/dL (ref 0.44–1.00)
GFR calc Af Amer: >60 mL/min (ref >60)
GFR calc non Af Amer: 59 mL/min — ABNORMAL LOW (ref >60)
Glucose, Bld: 98 mg/dL (ref 70–99)
Potassium: 3.7 mmol/L (ref 3.5–5.1)
Sodium: 144 mmol/L (ref 135–145)

## 2020-01-10 SURGERY — OPEN REDUCTION INTERNAL FIXATION (ORIF) DISTAL RADIUS FRACTURE
Anesthesia: Monitor Anesthesia Care | Site: Wrist | Laterality: Left

## 2020-01-10 MED ORDER — 0.9 % SODIUM CHLORIDE (POUR BTL) OPTIME
TOPICAL | Status: DC | PRN
  Administered 2020-01-10: 1000 mL

## 2020-01-10 MED ORDER — FENTANYL CITRATE (PF) 100 MCG/2ML IJ SOLN: 100.0000 ug | Freq: Once | INTRAMUSCULAR | Status: AC

## 2020-01-10 MED ORDER — CHLORHEXIDINE GLUCONATE 4 % EX LIQD: 60.0000 mL | Freq: Once | CUTANEOUS | Status: DC

## 2020-01-10 MED ORDER — LACTATED RINGERS IV SOLN: INTRAVENOUS | Status: DC

## 2020-01-10 MED ORDER — PROPOFOL 500 MG/50ML IV EMUL
INTRAVENOUS | Status: DC | PRN
  Administered 2020-01-10: 100 ug/kg/min via INTRAVENOUS

## 2020-01-10 MED ORDER — FENTANYL CITRATE (PF) 100 MCG/2ML IJ SOLN
INTRAMUSCULAR | Status: DC | PRN
  Administered 2020-01-10: 25 ug via INTRAVENOUS

## 2020-01-10 MED ORDER — FENTANYL CITRATE (PF) 100 MCG/2ML IJ SOLN
INTRAMUSCULAR | Status: AC
  Administered 2020-01-10: 14:00:00 100 ug via INTRAVENOUS
  Filled 2020-01-10: qty 2

## 2020-01-10 MED ORDER — PROMETHAZINE HCL 25 MG/ML IJ SOLN: 6.2500 mg | INTRAMUSCULAR | Status: DC | PRN

## 2020-01-10 MED ORDER — ACETAMINOPHEN 500 MG PO TABS
ORAL_TABLET | ORAL | Status: AC
  Administered 2020-01-10: 12:00:00 1000 mg via ORAL
  Filled 2020-01-10: qty 2

## 2020-01-10 MED ORDER — KETOROLAC TROMETHAMINE 15 MG/ML IJ SOLN: 15.0000 mg | Freq: Once | INTRAMUSCULAR | Status: DC | PRN

## 2020-01-10 MED ORDER — ACETAMINOPHEN 500 MG PO TABS: 1000.0000 mg | ORAL_TABLET | Freq: Once | ORAL | Status: AC

## 2020-01-10 MED ORDER — FENTANYL CITRATE (PF) 100 MCG/2ML IJ SOLN: 25.0000 ug | INTRAMUSCULAR | Status: DC | PRN

## 2020-01-10 MED ORDER — DEXAMETHASONE SODIUM PHOSPHATE 10 MG/ML IJ SOLN
INTRAMUSCULAR | Status: DC | PRN
  Administered 2020-01-10: 10 mg via INTRAVENOUS

## 2020-01-10 MED ORDER — FENTANYL CITRATE (PF) 250 MCG/5ML IJ SOLN
INTRAMUSCULAR | Status: AC
  Filled 2020-01-10: qty 5

## 2020-01-10 MED ORDER — BUPIVACAINE HCL (PF) 0.25 % IJ SOLN
INTRAMUSCULAR | Status: AC
  Filled 2020-01-10: qty 10

## 2020-01-10 MED ORDER — CLINDAMYCIN PHOSPHATE 900 MG/50ML IV SOLN
900.0000 mg | INTRAVENOUS | Status: AC
  Administered 2020-01-10: 900 mg via INTRAVENOUS

## 2020-01-10 MED ORDER — ROPIVACAINE HCL 5 MG/ML IJ SOLN
INTRAMUSCULAR | Status: DC | PRN
  Administered 2020-01-10: 30 mL via EPIDURAL

## 2020-01-10 MED ORDER — CLINDAMYCIN PHOSPHATE 900 MG/50ML IV SOLN
INTRAVENOUS | Status: AC
  Filled 2020-01-10: qty 50

## 2020-01-10 SURGICAL SUPPLY — 75 items
BIT DRILL 2.2 SS TIBIAL (BIT) ×2 IMPLANT
BLADE CLIPPER SURG (BLADE) IMPLANT
BNDG CMPR 9X4 STRL LF SNTH (GAUZE/BANDAGES/DRESSINGS) ×1
BNDG ELASTIC 3X5.8 VLCR STR LF (GAUZE/BANDAGES/DRESSINGS) ×3 IMPLANT
BNDG ELASTIC 4X5.8 VLCR STR LF (GAUZE/BANDAGES/DRESSINGS) ×3 IMPLANT
BNDG ESMARK 4X9 LF (GAUZE/BANDAGES/DRESSINGS) ×3 IMPLANT
BNDG GAUZE ELAST 4 BULKY (GAUZE/BANDAGES/DRESSINGS) ×3 IMPLANT
CANISTER SUCT 3000ML PPV (MISCELLANEOUS) ×3 IMPLANT
CORD BIPOLAR FORCEPS 12FT (ELECTRODE) ×3 IMPLANT
COVER SURGICAL LIGHT HANDLE (MISCELLANEOUS) ×3 IMPLANT
COVER WAND RF STERILE (DRAPES) ×3 IMPLANT
CUFF TOURN SGL QUICK 18X4 (TOURNIQUET CUFF) ×3 IMPLANT
CUFF TOURN SGL QUICK 24 (TOURNIQUET CUFF)
CUFF TRNQT CYL 24X4X16.5-23 (TOURNIQUET CUFF) IMPLANT
DECANTER SPIKE VIAL GLASS SM (MISCELLANEOUS) ×3 IMPLANT
DRAPE INCISE IOBAN 66X45 STRL (DRAPES) ×2 IMPLANT
DRAPE OEC MINIVIEW 54X84 (DRAPES) ×3 IMPLANT
DRAPE SURG 17X11 SM STRL (DRAPES) ×3 IMPLANT
DRSG ADAPTIC 3X8 NADH LF (GAUZE/BANDAGES/DRESSINGS) ×3 IMPLANT
DRSG EMULSION OIL 3X3 NADH (GAUZE/BANDAGES/DRESSINGS) ×2 IMPLANT
GAUZE 4X4 16PLY RFD (DISPOSABLE) ×3 IMPLANT
GAUZE SPONGE 4X4 12PLY STRL (GAUZE/BANDAGES/DRESSINGS) ×3 IMPLANT
GAUZE XEROFORM 5X9 LF (GAUZE/BANDAGES/DRESSINGS) ×3 IMPLANT
GLOVE BIOGEL PI IND STRL 8.5 (GLOVE) ×1 IMPLANT
GLOVE BIOGEL PI INDICATOR 8.5 (GLOVE) ×2
GLOVE SURG ORTHO 8.0 STRL STRW (GLOVE) ×3 IMPLANT
GOWN STRL REUS W/ TWL LRG LVL3 (GOWN DISPOSABLE) ×1 IMPLANT
GOWN STRL REUS W/ TWL XL LVL3 (GOWN DISPOSABLE) ×1 IMPLANT
GOWN STRL REUS W/TWL LRG LVL3 (GOWN DISPOSABLE) ×3
GOWN STRL REUS W/TWL XL LVL3 (GOWN DISPOSABLE) ×3
K-WIRE 1.6 (WIRE) ×3
K-WIRE FX5X1.6XNS BN SS (WIRE) ×1
KIT BASIN OR (CUSTOM PROCEDURE TRAY) ×3 IMPLANT
KIT TURNOVER KIT B (KITS) ×3 IMPLANT
KWIRE FX5X1.6XNS BN SS (WIRE) IMPLANT
NDL HYPO 25X1 1.5 SAFETY (NEEDLE) ×1 IMPLANT
NEEDLE HYPO 25X1 1.5 SAFETY (NEEDLE) ×3 IMPLANT
NS IRRIG 1000ML POUR BTL (IV SOLUTION) ×3 IMPLANT
PACK ORTHO EXTREMITY (CUSTOM PROCEDURE TRAY) ×3 IMPLANT
PAD ARMBOARD 7.5X6 YLW CONV (MISCELLANEOUS) ×6 IMPLANT
PAD CAST 3X4 CTTN HI CHSV (CAST SUPPLIES) IMPLANT
PAD CAST 4YDX4 CTTN HI CHSV (CAST SUPPLIES) ×1 IMPLANT
PADDING CAST COTTON 3X4 STRL (CAST SUPPLIES) ×3
PADDING CAST COTTON 4X4 STRL (CAST SUPPLIES) ×3
PEG LOCKING SMOOTH 2.2X16 (Screw) ×2 IMPLANT
PEG LOCKING SMOOTH 2.2X18 (Peg) ×4 IMPLANT
PEG LOCKING SMOOTH 2.2X20 (Screw) ×2 IMPLANT
PLATE NARROW DVR LEFT (Plate) ×2 IMPLANT
SCREW LOCK 12X2.7X 3 LD (Screw) IMPLANT
SCREW LOCK 14X2.7X 3 LD TPR (Screw) IMPLANT
SCREW LOCK 20X2.7X 3 LD TPR (Screw) IMPLANT
SCREW LOCK 22X2.7X 3 LD TPR (Screw) IMPLANT
SCREW LOCKING 2.7X12MM (Screw) ×3 IMPLANT
SCREW LOCKING 2.7X14 (Screw) ×6 IMPLANT
SCREW LOCKING 2.7X15MM (Screw) ×2 IMPLANT
SCREW LOCKING 2.7X20MM (Screw) ×3 IMPLANT
SCREW LOCKING 2.7X22MM (Screw) ×3 IMPLANT
SCREW MULTI DIRECTIONAL 2.7X12 (Screw) ×2 IMPLANT
SCREW MULTI DIRECTIONAL 2.7X16 (Screw) ×4 IMPLANT
SCREW MULTI DIRECTIONAL 2.7X18 (Screw) ×2 IMPLANT
SCREW NLOCK 24X2.7 3 LD (Screw) IMPLANT
SCREW NONLOCK 2.7X24 (Screw) ×3 IMPLANT
SOAP 2 % CHG 4 OZ (WOUND CARE) ×3 IMPLANT
SPONGE LAP 4X18 RFD (DISPOSABLE) ×3 IMPLANT
SUT PROLENE 4 0 PS 2 18 (SUTURE) IMPLANT
SUT VIC AB 2-0 CT1 27 (SUTURE)
SUT VIC AB 2-0 CT1 TAPERPNT 27 (SUTURE) IMPLANT
SUT VICRYL 4-0 PS2 18IN ABS (SUTURE) IMPLANT
SYR CONTROL 10ML LL (SYRINGE) IMPLANT
TOWEL GREEN STERILE (TOWEL DISPOSABLE) ×3 IMPLANT
TOWEL GREEN STERILE FF (TOWEL DISPOSABLE) IMPLANT
TUBE CONNECTING 12'X1/4 (SUCTIONS) ×1
TUBE CONNECTING 12X1/4 (SUCTIONS) ×2 IMPLANT
WATER STERILE IRR 1000ML POUR (IV SOLUTION) ×3 IMPLANT
YANKAUER SUCT BULB TIP NO VENT (SUCTIONS) IMPLANT

## 2020-01-10 NOTE — Anesthesia Procedure Notes (Signed)
Anesthesia Regional Block: Supraclavicular block   Pre-Anesthetic Checklist: ,, timeout performed, Correct Patient, Correct Site, Correct Laterality, Correct Procedure, Correct Position, site marked, Risks and benefits discussed,  Surgical consent,  Pre-op evaluation,  At surgeon's request and post-op pain management  Laterality: Left  Prep: Maximum Sterile Barrier Precautions used, chloraprep       Needles:  Injection technique: Single-shot  Needle Type: Echogenic Stimulator Needle     Needle Length: 9cm  Needle Gauge: 21     Additional Needles:   Procedures:,,,, ultrasound used (permanent image in chart),,,,  Narrative:  Start time: 01/10/2020 1:45 PM End time: 01/10/2020 1:50 PM Injection made incrementally with aspirations every 5 mL.  Performed by: Personally  Anesthesiologist: Pervis Hocking, DO  Additional Notes: 2% Lidocaine skin wheel.

## 2020-01-10 NOTE — H&P (Signed)
Whitney Ray is an 79 y.o. female.   Chief Complaint: LEFT WRIST PAIN  HPI: The patient is a 79 y/o right hand dominant female who fell down a set of stairs on 01/02/20 causing an injury to her left wrist. She was seen at the hospital and was treated with a splint and pain medication.  She followed up in our office for further evaluation. She complained of pain, weakness, swelling, and stiffness. Discussed the reason and rationale for surgical intervention.  She is here today for surgery.  She denies chest pain, shortness of breath, fever, chills, nausea, vomiting, or diarrhea.   Past Medical History:  Diagnosis Date  . Allergic rhinitis   . Dementia ([place])   . History of brain surgery   . Hypertension   . Metabolic syndrome    elevated bs, elevated cholesterol obese  . Mitral valve prolapse   . PMS (premenstrual syndrome)   . Renal insufficiency   . Sun-damaged skin    chronic  . Urinary incontinence     Past Surgical History:  Procedure Laterality Date  . BRAIN SURGERY      History reviewed. No pertinent family history. Social History:  reports that she has never smoked. She has never used smokeless tobacco. She reports that she does not drink alcohol or use drugs.  Allergies:  Allergies  Allergen Reactions  . Penicillins Hives    *Family and Patient don't remember that she is allergic to this* Has patient had a PCN reaction causing immediate rash, facial/tongue/throat swelling, SOB or lightheadedness with hypotension: Unknown Has patient had a PCN reaction causing severe rash involving mucus membranes or skin necrosis: Unknown Has patient had a PCN reaction that required hospitalization: Unknown Has patient had a PCN reaction occurring within the last 10 years: Unknown If all of the above answers are "NO",     No medications prior to admission.    No results found for this or any previous visit (from the past 48 hour(s)). No results found.  ROS NO RECENT  ILLNESSES OR HOSPITALIZATIONS  There were no vitals taken for this visit. Physical Exam  General Appearance:  Alert, cooperative, no distress, appears stated age  Head:  Normocephalic, without obvious abnormality, atraumatic  Eyes:  Pupils equal, conjunctiva/corneas clear,         Throat: Lips, mucosa, and tongue normal; teeth and gums normal  Neck: No visible masses     Lungs:   respirations unlabored  Chest Wall:  No tenderness or deformity  Heart:  Regular rate and rhythm,  Abdomen:   Soft, non-tender,         Extremities: LUE: OBVIOUS DEFORMITY TO WRIST, FINGERS WARM WELL PERFUSED  NO OPEN WOUNDS  Pulses: 2+ and symmetric  Skin: Skin color, texture, turgor normal, no rashes or lesions     Neurologic: Normal    Assessment LEFT DISTAL RADIUS FRACTURE, DISPLACED COMMINUTED   Plan LEFT DISTAL RADIUS OPEN REDUCTION AND INTERNAL FIXATION WITH REPAIR AS INDICATED  R/B/A DISCUSSED WITH PT'S FAMILY IN OFFICE  PT VOICED UNDERSTANDING OF PLAN CONSENT SIGNED DAY OF SURGERY PT SEEN AND EXAMINED PRIOR TO OPERATIVE PROCEDURE/DAY OF SURGERY SITE MARKED. QUESTIONS ANSWERED WILL GO HOME FOLLOWING SURGERY  WE ARE PLANNING SURGERY FOR YOUR UPPER EXTREMITY. THE RISKS AND BENEFITS OF SURGERY INCLUDE BUT NOT LIMITED TO BLEEDING INFECTION, DAMAGE TO NEARBY NERVES ARTERIES TENDONS, FAILURE OF SURGERY TO ACCOMPLISH ITS INTENDED GOALS, PERSISTENT SYMPTOMS AND NEED FOR FURTHER SURGICAL INTERVENTION. WITH THIS IN MIND WE WILL  PROCEED. I HAVE DISCUSSED WITH THE PATIENT THE PRE AND POSTOPERATIVE REGIMEN AND THE DOS AND DON'TS. PT VOICED UNDERSTANDING AND INFORMED CONSENT SIGNED.   Iran Planas MD 01/10/20  1541  Whitney Ray 01/10/2020, 9:27 AM

## 2020-01-10 NOTE — Transfer of Care (Signed)
Immediate Anesthesia Transfer of Care Note  Patient: Whitney Ray  Procedure(s) Performed: OPEN REDUCTION INTERNAL FIXATION (ORIF) DISTAL RADIAL FRACTURE (Left Wrist)  Patient Location: PACU  Anesthesia Type:MAC and Regional  Level of Consciousness: responds to stimulation  Airway & Oxygen Therapy: Patient Spontanous Breathing and Patient connected to face mask oxygen  Post-op Assessment: Report given to RN and Post -op Vital signs reviewed and stable  Post vital signs: Reviewed and stable  Last Vitals:  Vitals Value Taken Time  BP    Temp    Pulse 60 01/10/20 1712  Resp 12 01/10/20 1712  SpO2 99 % 01/10/20 1712  Vitals shown include unvalidated device data.  Last Pain:  Vitals:   01/10/20 1355  PainSc: 0-No pain      Patients Stated Pain Goal: 3 (60/63/01 6010)  Complications: No apparent anesthesia complications

## 2020-01-10 NOTE — Op Note (Signed)
PREOPERATIVE DIAGNOSIS: Left wrist intra-articular distal radius fracture 3 more fragments  POSTOPERATIVE DIAGNOSIS: Same  ATTENDING SURGEON: Dr. Iran Planas who scrubbed and present for the entire procedure  ASSISTANT SURGEON: Gertie Fey, PA-C was scrubbed and necessary for reduction internal fixation closure and splinting in a timely fashion  ANESTHESIA: Regional with IV sedation  OPERATIVE PROCEDURE: #1: Open treatment of left wrist intra-articular distal radius fracture 3 more fragments #2: Left wrist brachial radialis tendon release, tendon tenotomy #3: Left wrist radiographs 3 views  IMPLANTS: [employer] DVR cross lock narrow  RADIOGRAPHIC INTERPRETATION: AP lateral oblique views of the wrist do show the volar plate fixation in place in good position  SURGICAL INDICATIONS: Patient is a right-hand-dominant female who sustained a comminuted intra-articular distal radius fracture.  Given the gross deformity and displacement is recommended that she undergo the above procedure.  The risks of surgery include but not limited to bleeding infection damage nearby nerves arteries or tendons nonunion malunion hardware failure loss of motion the wrist and digits.  Complete relief of symptoms and need for further surgical intervention  SURGICAL TECHNIQUE: Patient was palpated on the preop holding area marked for marker made on left wrist indicate the correct operative site.  Patient brought back operating placed supine on anesthesia table where the regional anesthetic was administered.  Patient tolerated well.  Well-padded tourniquet was then placed on left brachium stay with the appropriate drape.  Left upper extremities then prepped and draped normal sterile fashion.  A timeout was called the correct site was identified procedure then begun.  Attention was then turned the left wrist.  A longitudinal incision made directly over the FCR sheath.  Dissection carried down through the skin and  subcutaneous tissue.  The FCR sheath was then opened up and the pronator quadratus was elevated carefully sweeping the FPL out of the way.  An L-shaped pronator quadratus flap was then created.  This exposed the highly comminuted intra-articular distal radius fracture 3 more fragments.  The patient had a marked displacement.  The brachioradialis was then carefully tenotomized tendon release was then done to allow for restoration of the radial column and positioning of the distal fragment.  This was a separate intervention to restore overall position and alignment.  At the conclusion of this open reduction was then performed bringing the wrist back out to length.  Once this was done the volar plate was then applied distally is fixed distally with combination distal locking pegs and screws.  After fixation distally was then placed proximally and fixation was then carried out to the shaft.  Shaft fixation was carried out with a combination of locking screws.  The wound was then thoroughly irrigated.  Final radiographs were then obtained.  Patient underwent closure of the pronator quadratus with 2-0 Vicryl suture the subcutaneous tissues closed with 4-0 Vicryl and skin closed with 4-0 Vicryl repeat.  Adaptic dressing sterile compressive bandaging applied.  The patient is and placed in a well-padded volar splint taken recovery in good condition  POSTOPERATIVE PLAN: Patient be discharged to home.  See him back in the office in 2 weeks for wound check x-rays application of a short arm cast likely transition to to a brace at the 4-week mark.  Home therapy will be initiated.  Radiographs each visit

## 2020-01-10 NOTE — Anesthesia Preprocedure Evaluation (Addendum)
Anesthesia Evaluation  Patient identified by MRN, date of birth, ID band Patient awake    Reviewed: Allergy & Precautions, NPO status , Patient's Chart, lab work & pertinent test results  Airway Mallampati: II  TM Distance: >3 FB Neck ROM: Full    Dental no notable dental hx. (+) Teeth Intact, Dental Advisory Given   Pulmonary neg pulmonary ROS,    Pulmonary exam normal breath sounds clear to auscultation       Cardiovascular hypertension, Normal cardiovascular exam+ Valvular Problems/Murmurs MVP  Rhythm:Regular Rate:Normal  Last echo 2007: - Overall left ventricular systolic function was normal. Left     ventricular ejection fraction was estimated to be 70 %. There     were no left ventricular regional wall motion abnormalities.  - There is flat closure of the mitral valve.  - The left atrium was mildly dilated.    Neuro/Psych PSYCHIATRIC DISORDERS Dementia negative neurological ROS     GI/Hepatic negative GI ROS, Neg liver ROS,   Endo/Other  negative endocrine ROS  Renal/GU negative Renal ROS  negative genitourinary   Musculoskeletal Distal radial fx s/p fall   Abdominal Normal abdominal exam  (+)   Peds negative pediatric ROS (+)  Hematology negative hematology ROS (+)   Anesthesia Other Findings HLD  Reproductive/Obstetrics negative OB ROS                           Anesthesia Physical Anesthesia Plan  ASA: III  Anesthesia Plan: Regional and MAC   Post-op Pain Management:  Regional for Post-op pain   Induction:   PONV Risk Score and Plan: 2 and Propofol infusion and TIVA  Airway Management Planned: Natural Airway and Simple Face Mask  Additional Equipment: None  Intra-op Plan:   Post-operative Plan:   Informed Consent: I have reviewed the patients History and Physical, chart, labs and discussed the procedure including the risks, benefits and alternatives for  the proposed anesthesia with the patient or authorized representative who has indicated his/her understanding and acceptance.       Plan Discussed with: CRNA  Anesthesia Plan Comments:         Anesthesia Quick Evaluation

## 2020-01-10 NOTE — Discharge Instructions (Addendum)
KEEP BANDAGE CLEAN AND DRY CALL OFFICE FOR F/U APPT [phone] in 2 weeks KEEP HAND ELEVATED ABOVE HEART OK TO APPLY ICE TO OPERATIVE AREA CONTACT OFFICE IF ANY WORSENING PAIN OR CONCERNS.  

## 2020-01-11 ENCOUNTER — Encounter: Payer: Self-pay | Admitting: *Deleted

## 2020-01-11 NOTE — Anesthesia Postprocedure Evaluation (Signed)
Anesthesia Post Note  Patient: Whitney Ray  Procedure(s) Performed: OPEN REDUCTION INTERNAL FIXATION (ORIF) DISTAL RADIAL FRACTURE (Left Wrist)     Patient location during evaluation: PACU Anesthesia Type: Regional Level of consciousness: awake and alert Pain management: pain level controlled Vital Signs Assessment: post-procedure vital signs reviewed and stable Respiratory status: spontaneous breathing, nonlabored ventilation, respiratory function stable and patient connected to nasal cannula oxygen Cardiovascular status: stable and blood pressure returned to baseline Postop Assessment: no apparent nausea or vomiting Anesthetic complications: no    Last Vitals:  Vitals:   01/10/20 1715 01/10/20 1745  BP: 106/65 (!) 143/78  Pulse: 60 63  Resp: 12 12  Temp:  (!) 36.1 C  SpO2: 97% 92%    Last Pain:  Vitals:   01/10/20 1730  PainSc: 0-No pain                 Kedarius Aloisi COKER

## 2020-01-18 ENCOUNTER — Encounter: Payer: Self-pay | Admitting: Anesthesiology

## 2020-01-20 ENCOUNTER — Emergency Department (HOSPITAL_COMMUNITY): Payer: Medicare PPO

## 2020-01-20 ENCOUNTER — Other Ambulatory Visit: Payer: Self-pay

## 2020-01-20 ENCOUNTER — Emergency Department (HOSPITAL_COMMUNITY)
Admission: EM | Admit: 2020-01-20 | Discharge: 2020-01-20 | Disposition: A | Discharge status: Home / Self Care | Payer: Medicare PPO | Attending: Emergency Medicine | Admitting: Emergency Medicine

## 2020-01-20 DIAGNOSIS — I10 Essential (primary) hypertension: Secondary | ICD-10-CM | POA: Diagnosis not present

## 2020-01-20 DIAGNOSIS — F0390 Unspecified dementia without behavioral disturbance: Secondary | ICD-10-CM | POA: Diagnosis not present

## 2020-01-20 DIAGNOSIS — Z79899 Other long term (current) drug therapy: Secondary | ICD-10-CM | POA: Diagnosis not present

## 2020-01-20 DIAGNOSIS — R1032 Left lower quadrant pain: Secondary | ICD-10-CM | POA: Diagnosis present

## 2020-01-20 DIAGNOSIS — K5903 Drug induced constipation: Secondary | ICD-10-CM | POA: Diagnosis not present

## 2020-01-20 LAB — COMPREHENSIVE METABOLIC PANEL
ALT: 23 U/L (ref 0–44)
AST: 25 U/L (ref 15–41)
Albumin: 3.4 g/dL — ABNORMAL LOW (ref 3.5–5.0)
Alkaline Phosphatase: 73 U/L (ref 38–126)
Anion gap: 9 (ref 5–15)
BUN: 18 mg/dL (ref 8–23)
CO2: 25 mmol/L (ref 22–32)
Calcium: 8.8 mg/dL — ABNORMAL LOW (ref 8.9–10.3)
Chloride: 107 mmol/L (ref 98–111)
Creatinine, Ser: 0.78 mg/dL (ref 0.44–1.00)
GFR calc Af Amer: >60 mL/min (ref >60)
GFR calc non Af Amer: >60 mL/min (ref >60)
Glucose, Bld: 104 mg/dL — ABNORMAL HIGH (ref 70–99)
Potassium: 4 mmol/L (ref 3.5–5.1)
Sodium: 141 mmol/L (ref 135–145)
Total Bilirubin: 0.8 mg/dL (ref 0.3–1.2)
Total Protein: 6.1 g/dL — ABNORMAL LOW (ref 6.5–8.1)

## 2020-01-20 LAB — LIPASE, BLOOD: Lipase: 20 U/L (ref 11–51)

## 2020-01-20 LAB — CBC WITH DIFFERENTIAL/PLATELET
Abs Immature Granulocytes: 0.02 10*3/uL (ref 0.00–0.07)
Basophils Absolute: 0.1 10*3/uL (ref 0.0–0.1)
Basophils Relative: 1 %
Eosinophils Absolute: 0.2 10*3/uL (ref 0.0–0.5)
Eosinophils Relative: 3 %
HCT: 41 % (ref 36.0–46.0)
Hemoglobin: 13.4 g/dL (ref 12.0–15.0)
Immature Granulocytes: 0 %
Lymphocytes Relative: 21 %
Lymphs Abs: 1.5 10*3/uL (ref 0.7–4.0)
MCH: 30.6 pg (ref 26.0–34.0)
MCHC: 32.7 g/dL (ref 30.0–36.0)
MCV: 93.6 fL (ref 80.0–100.0)
Monocytes Absolute: 0.5 10*3/uL (ref 0.1–1.0)
Monocytes Relative: 8 %
Neutro Abs: 4.7 10*3/uL (ref 1.7–7.7)
Neutrophils Relative %: 67 %
Platelets: 207 10*3/uL (ref 150–400)
RBC: 4.38 MIL/uL (ref 3.87–5.11)
RDW: 14.5 % (ref 11.5–15.5)
WBC: 7 10*3/uL (ref 4.0–10.5)
nRBC: 0 % (ref 0.0–0.2)

## 2020-01-20 LAB — URINALYSIS, ROUTINE W REFLEX MICROSCOPIC
Bilirubin Urine: NEGATIVE
Glucose, UA: NEGATIVE mg/dL
Hgb urine dipstick: NEGATIVE
Ketones, ur: NEGATIVE mg/dL
Leukocytes,Ua: NEGATIVE
Nitrite: NEGATIVE
Protein, ur: NEGATIVE mg/dL
Specific Gravity, Urine: 1.018 (ref 1.005–1.030)
pH: 7 (ref 5.0–8.0)

## 2020-01-20 MED ORDER — DOCUSATE SODIUM 100 MG PO CAPS: 100.0000 mg | ORAL_CAPSULE | Freq: Two times a day (BID) | ORAL | 0 refills | Status: AC

## 2020-01-20 MED ORDER — POLYETHYLENE GLYCOL 3350 17 G PO PACK: 17.0000 g | PACK | Freq: Every day | ORAL | 0 refills | Status: AC

## 2020-01-20 MED ORDER — SODIUM CHLORIDE (PF) 0.9 % IJ SOLN
INTRAMUSCULAR | Status: AC
  Filled 2020-01-20: qty 50

## 2020-01-20 MED ORDER — LORAZEPAM 0.5 MG PO TABS: 0.5000 mg | ORAL_TABLET | Freq: Every evening | ORAL | 0 refills | Status: DC | PRN

## 2020-01-20 MED ORDER — IOHEXOL 300 MG/ML  SOLN
100.0000 mL | Freq: Once | INTRAMUSCULAR | Status: AC | PRN
  Administered 2020-01-20: 100 mL via INTRAVENOUS

## 2020-01-20 MED ORDER — LORAZEPAM 2 MG/ML IJ SOLN
0.5000 mg | Freq: Once | INTRAMUSCULAR | Status: AC
  Administered 2020-01-20: 13:00:00 0.5 mg via INTRAVENOUS
  Filled 2020-01-20: qty 1

## 2020-01-20 NOTE — ED Notes (Signed)
Pt provided with Sandwich and Juice per PA. Pt and son updated on plan of care.

## 2020-01-20 NOTE — Discharge Instructions (Addendum)
Take 7 capfuls of MiraLAX in 32 ounces of liquid today.  Starting tomorrow you can take 1-2 capfuls of MiraLAX in 8 ounces of liquid daily.  Take Colace (this is a stool softener) twice daily. Drink plenty of water.  I would stop the antibiotics for now.  Call your PCPs office first thing tomorrow morning to see if the urine culture came back.  If the urine culture grew any concerning bacteria then you can restart the antibiotic.  There was no evidence of UTI on urine sample today.  You can take a tablet of Ativan at night as needed for agitation or insomnia.  Follow-up with the PCP for reevaluation of symptoms.  Return to the emergency department if any concerning signs or symptoms develop such as high fevers, persistent vomiting, severe pain, not eating or drinking.

## 2020-01-20 NOTE — ED Triage Notes (Signed)
Pt BIBA from home.   Per EMS Pts family reports c/o LLQ abdominal pain x4 days, worsening. Pt also reports hematuria x4 days. Pt arrived with left arm in sling d/t broken arm x3 weeks- pt has been taking pain medications for this.  Pt AOx1, at baseline. Home health RN reports "couple small BMs yesterday."

## 2020-01-20 NOTE — ED Notes (Addendum)
Son, shelton, reports that caregiver called him this AM d/t pt complaints of abdominal pain. Pt went to PCP earlier this week d/t blood in urine, UTI?  PCP prescribed antibiotics.   PCP tele visit with pt this AM was concerned about pts ongoing abd pain.  Wanted further evaluation. Son denies any known fevers or sick contacts for pt.      Darnelle Maffucci (son) may be able to come sit with pt. Phone #- [phone]

## 2020-01-20 NOTE — ED Notes (Signed)
Pt transported to CT ?

## 2020-01-20 NOTE — ED Notes (Signed)
Patient got out the bed and would not go back in the room.  Patient was very upset we had to call security to get her back in the bed.

## 2020-01-20 NOTE — ED Provider Notes (Signed)
[facility] DEPT Provider Note   CSN: [id number] Arrival date & time: 01/20/20  [id number]     History Chief Complaint  Patient presents with  . Abdominal Pain   Level 5 caveat due to dementia  Whitney Ray is a 79 y.o. female with history of dementia, mitral valve prolapse, hypertension presents brought in by EMS for report of left lower quadrant abdominal pain for 4 days and hematuria for 4 days.  On my assessment the patient tells me she does not know why she is here.  She thinks that she is "in a building where they teach people things".  She thinks the year is 60 and that she lives in [place] with her parents who are in their 17s or 6s.  She does not know the day of the week, the month or the year.  Per triage note she is alert and oriented to person only at baseline.  She denies any pain including headache, chest pain, shortness of breath, abdominal pain, nausea, vomiting, or urinary symptoms.  Of note, patient is 10 days status post operative repair of a left wrist fracture.   I spoke to patient's son who states that he does not live with her but she has 24/7 around-the-clock care with a caregiver that lives with her at home.  The caregiver noted the patient has been "doubled over in pain" intermittently over the last couple of days.  She has been constipated and not passing much stool.  She also noted some blood in the patient's diaper a couple of days ago and so she was taken to the patient's PCP where she was diagnosed with a possible UTI.  The son notes that they did get a urine culture but he is not sure what antibiotic she was prescribed.  She has had a couple doses of this so far.  The history is provided by the patient.       Past Medical History:  Diagnosis Date  . Allergic rhinitis   . Dementia ([place])   . History of brain surgery   . Hypertension   . Metabolic syndrome    elevated bs, elevated cholesterol obese  . Mitral  valve prolapse   . PMS (premenstrual syndrome)   . Renal insufficiency   . Sun-damaged skin    chronic  . Urinary incontinence     Patient Active Problem List   Diagnosis Date Noted  . Closed fracture of left distal radius 01/10/2020  . Fall 02/21/2019  . Wrist fracture 02/20/2019  . Accidental fall 02/20/2019  . Other physeal fracture of left metatarsal, initial encounter for closed fracture 02/20/2019  . Leukocytosis 02/20/2019  . Vertigo 11/29/2014  . Dysplastic nevi 09/26/2011  . SOLAR KERATOSIS 08/31/2009  . MIXED HEARING LOSS BILATERAL 08/16/2008  . METABOLIC SYNDROME X [id number]  . Essential hypertension 08/07/2007  . Mitral valve disease 08/07/2007  . ALLERGIC RHINITIS 08/07/2007  . DERMATITIS, CNTCT, ACUTE D/T SOLAR RADIATION 08/07/2007  . URINARY INCONTINENCE 08/07/2007    Past Surgical History:  Procedure Laterality Date  . BRAIN SURGERY    . OPEN REDUCTION INTERNAL FIXATION (ORIF) DISTAL RADIAL FRACTURE Left 01/10/2020   Procedure: OPEN REDUCTION INTERNAL FIXATION (ORIF) DISTAL RADIAL FRACTURE;  Surgeon: Iran Planas, MD;  Location: [facility];  Service: Orthopedics;  Laterality: Left;     OB History   No obstetric history on file.     No family history on file.  Social History   Tobacco Use  .  Smoking status: Never Smoker  . Smokeless tobacco: Never Used  Substance Use Topics  . Alcohol use: No  . Drug use: No    Home Medications Prior to Admission medications   Medication Sig Start Date End Date Taking? Authorizing Provider  cefUROXime (CEFTIN) 250 MG tablet Take 250 mg by mouth 2 (two) times daily with a meal.   Yes [provider]  donepezil (ARICEPT) 10 MG tablet Take 10 mg by mouth at bedtime.   Yes [provider]  Melatonin 5 MG TABS Take 10 mg by mouth at bedtime.   Yes [provider]  memantine (NAMENDA) 5 MG tablet Take 5 mg by mouth 2 (two) times daily. 12/26/19  Yes [provider]  methocarbamol  (ROBAXIN) 500 MG tablet Take 500 mg by mouth 4 (four) times daily as needed for muscle spasms.  01/10/20  Yes [provider]  oxyCODONE-acetaminophen (PERCOCET/ROXICET) 5-325 MG tablet Take 1 tablet by mouth every 6 (six) hours. 01/10/20  Yes [provider]  pravastatin (PRAVACHOL) 20 MG tablet Take 20 mg by mouth daily.   Yes [provider]  QUEtiapine (SEROQUEL) 25 MG tablet Take 50 mg by mouth at bedtime.  02/03/19  Yes [provider]  SUPER B COMPLEX/C PO Take 1 tablet by mouth daily.   Yes [provider]  docusate sodium (COLACE) 100 MG capsule Take 1 capsule (100 mg total) by mouth every 12 (twelve) hours. 01/20/20   Odas Ozer A, PA-C  LORazepam (ATIVAN) 0.5 MG tablet Take 1 tablet (0.5 mg total) by mouth at bedtime as needed for anxiety or sleep. 01/20/20   Vivi Piccirilli A, PA-C  polyethylene glycol (MIRALAX) 17 g packet Take 17 g by mouth daily. 01/20/20   Rodell Perna A, PA-C    Allergies    Penicillins  Review of Systems   Review of Systems  Unable to perform ROS: Dementia    Physical Exam Updated Vital Signs BP 143/91 (BP Location: Right Arm)   Pulse 79   Temp 98.5 F (36.9 C) (Oral)   Resp 18   SpO2 93%   Physical Exam Vitals and nursing note reviewed.  Constitutional:      General: She is not in acute distress.    Appearance: She is well-developed.  HENT:     Head: Normocephalic and atraumatic.  Eyes:     General:        Right eye: No discharge.        Left eye: No discharge.     Conjunctiva/sclera: Conjunctivae normal.  Neck:     Vascular: No JVD.     Trachea: No tracheal deviation.  Cardiovascular:     Rate and Rhythm: Normal rate and regular rhythm.  Pulmonary:     Effort: Pulmonary effort is normal.     Comments: Scattered rales, speaking in full sentences without difficulty Abdominal:     General: Abdomen is flat. Bowel sounds are decreased. There is no distension.     Palpations: Abdomen is soft.      Tenderness: There is no abdominal tenderness. There is no right CVA tenderness, left CVA tenderness, guarding or rebound.  Genitourinary:    Comments: Examination performed in the presence of a chaperone.  No thrombosed or bleeding external hemorrhoids.  No tenderness to palpation.  She has a small amount of nonbloody, somewhat firm stool coming from the anus. Musculoskeletal:     Comments: Cast to the left wrist  Skin:    General: Skin  is warm and dry.     Findings: No erythema.  Neurological:     Mental Status: She is alert. Mental status is at baseline. She is confused.     GCS: GCS eye subscore is 4. GCS verbal subscore is 5. GCS motor subscore is 6.     Comments: Speech is fluent without any evidence of dysarthria or aphasia.  Patient is confused.  Moves all extremities spontaneously without difficulty.  Sensation intact to light touch of face and extremities.  Cranial nerves II through XII tested and intact.  No pronator drift.  Psychiatric:        Behavior: Behavior normal.     ED Results / Procedures / Treatments   Labs (all labs ordered are listed, but only abnormal results are displayed) Labs Reviewed  COMPREHENSIVE METABOLIC PANEL - Abnormal; Notable for the following components:      Result Value   Glucose, Bld 104 (*)    Calcium 8.8 (*)    Total Protein 6.1 (*)    Albumin 3.4 (*)    All other components within normal limits  LIPASE, BLOOD  CBC WITH DIFFERENTIAL/PLATELET  URINALYSIS, ROUTINE W REFLEX MICROSCOPIC    EKG None  Radiology CT ABDOMEN PELVIS W CONTRAST  Result Date: 01/20/2020 CLINICAL DATA:  Left lower quadrant abdominal pain for 4 days. EXAM: CT ABDOMEN AND PELVIS WITH CONTRAST TECHNIQUE: Multidetector CT imaging of the abdomen and pelvis was performed using the standard protocol following bolus administration of intravenous contrast. CONTRAST:  166mL OMNIPAQUE IOHEXOL 300 MG/ML  SOLN COMPARISON:  None. FINDINGS: Lower chest: Dependent bibasilar  atelectasis/edema. No infiltrates or effusions. No worrisome pulmonary lesions. The heart is borderline enlarged. No pericardial effusion. Hepatobiliary: No focal hepatic lesions or intrahepatic biliary dilatation. Gallbladder appears normal. No common bile duct dilatation. Pancreas: No mass, inflammation or ductal dilatation. Prominent fatty interstices. Spleen: Normal size. No focal lesions. Adrenals/Urinary Tract: The adrenal glands and kidneys are unremarkable. The bladder is normal. Stomach/Bowel: The stomach, duodenum, small bowel and colon are unremarkable. No acute inflammatory changes, mass lesions or obstructive findings. The terminal ileum is normal. The appendix is normal. Moderate stool in the colon and down into the rectum could suggest constipation. Minimal scattered sigmoid colon diverticulosis but no findings for acute diverticulitis. Vascular/Lymphatic: The aorta is normal in caliber. No dissection. The branch vessels are patent. The major venous structures are patent. No mesenteric or retroperitoneal mass or adenopathy. Small scattered lymph nodes are noted. Reproductive: The uterus and ovaries are unremarkable. Small calcified fibroids are noted. Other: No pelvic mass or adenopathy. No free pelvic fluid collections. No inguinal mass or adenopathy. No abdominal wall hernia or subcutaneous lesions. Musculoskeletal: No significant bony findings. IMPRESSION: 1. No acute abdominal/pelvic findings, mass lesions or adenopathy. 2. No renal, ureteral or bladder calculi or mass. 3. Moderate stool in the colon and down into the rectum may suggest constipation. Electronically Signed   By: Marijo Sanes M.D.   On: 01/20/2020 13:48   DG Abdomen Acute W/Chest  Result Date: 01/20/2020 CLINICAL DATA:  Dementia.  Questionable abdominal pain. EXAM: DG ABDOMEN ACUTE W/ 1V CHEST COMPARISON:  None. FINDINGS: Normal bowel gas pattern.  No free air. No evidence of renal or ureteral stones. Abdominal and pelvic soft  tissues are unremarkable. Cardiac silhouette normal in size. No mediastinal or hilar masses. Clear lungs. No acute skeletal abnormality. IMPRESSION: Negative abdominal radiographs.  No acute cardiopulmonary disease. Electronically Signed   By: Lajean Manes M.D.   On: 01/20/2020  11:03    Procedures Procedures (including critical care time)  Medications Ordered in ED Medications  LORazepam (ATIVAN) injection 0.5 mg (0.5 mg Intravenous Given 01/20/20 1259)  iohexol (OMNIPAQUE) 300 MG/ML solution 100 mL (100 mLs Intravenous Contrast Given 01/20/20 1326)    ED Course  I have reviewed the triage vital signs and the nursing notes.  Pertinent labs & imaging results that were available during my care of the patient were reviewed by me and considered in my medical decision making (see chart for details).    MDM Rules/Calculators/A&P                      Patient presenting for evaluation of constipation, left-sided abdominal pain for a few days.  History is limited due to the patient's dementia.  Her caregiver lives with her at home but her son was at the bedside and helped supplement some history but does not know her entire medical history.  In the ED she is afebrile, vital signs are stable.  Her abdomen is soft and nontender on my assessment but RN informed me that she did elicit some tenderness on palpation of the left side of the abdomen.  No rebound or guarding noted.  No evidence of bleeding hemorrhoids lower GI bleed on exam today.  We will obtain lab work and imaging and reassess.  Lab work reviewed and interpreted by myself shows no leukocytosis, no anemia, no metabolic derangements, no renal insufficiency.  UA does not suggest UTI.  I informed the patient's son of this.  Patient has not had any urinary symptoms per the son.  Shared decision-making conversation and elected to hold off on any further antibiotic treatment at this time until her urine culture comes back.  Imaging shows no acute  abdominopelvic findings but moderate stool in the colon and down to the rectum suggesting constipation.  In the ED the patient did become mildly agitated and required low-dose Ativan with improvement.  Elevation she is resting comfortably in no apparent distress.  She is tolerating p.o. food and fluids without difficulty.  Serial abdominal examinations remain benign.  Suspicion of acute surgical abdominal pathology.  Suspect that she is constipated due to taking narcotic pain medicines after surgery.  Discussed bowel regimen.  Patient son is concerned that she may be "amped up" at home tonight as she sometimes gets when she has been in the hospital for a day.  He is requesting a medication to help calm her.  Will send home with a few tablets of low-dose Ativan to take as needed for anxiety, agitation and insomnia.  Recommend close follow-up with PCP for reevaluation of symptoms.  Discussed strict ED return precautions.  Patient son verbalized understanding of and agreement with plan and patient is stable for discharge at this time.  Discussed with Dr. Alvino Chapel who reviewed lab work and imaging and agrees with assessment and plan at this time   Final Clinical Impression(s) / ED Diagnoses Final diagnoses:  Drug-induced constipation    Rx / DC Orders ED Discharge Orders         Ordered    LORazepam (ATIVAN) 0.5 MG tablet  At bedtime PRN     01/20/20 1529    polyethylene glycol (MIRALAX) 17 g packet  Daily     01/20/20 1529    docusate sodium (COLACE) 100 MG capsule  Every 12 hours     01/20/20 1529           Nils Flack, D'Lo  A, PA-C 01/21/20 [id number]    Davonna Belling, MD 01/21/20 [phone]

## 2020-01-20 NOTE — ED Notes (Signed)
Son, Whitney Ray, at bedside.

## 2020-01-24 ENCOUNTER — Encounter: Payer: Self-pay | Admitting: *Deleted

## 2020-01-30 ENCOUNTER — Ambulatory Visit: Payer: Medicare PPO | Attending: Internal Medicine

## 2020-01-30 DIAGNOSIS — Z23 Encounter for immunization: Secondary | ICD-10-CM | POA: Insufficient documentation

## 2020-01-30 NOTE — Progress Notes (Signed)
   Covid-19 Vaccination Clinic  Name:  Whitney Ray    MRN: [id number] DOB: 08/13/1941  01/30/2020  [name] was observed post Covid-19 immunization for 15 minutes without incidence. She was provided with Vaccine Information Sheet and instruction to access the V-Safe system.   Ms. Blood was instructed to call 911 with any severe reactions post vaccine: Marland Kitchen Difficulty breathing  . Swelling of your face and throat  . A fast heartbeat  . A bad rash all over your body  . Dizziness and weakness    Immunizations Administered    Name Date Dose VIS Date Route   Pfizer COVID-19 Vaccine 01/30/2020  1:15 PM 0.3 mL 11/19/2019 Intramuscular   Manufacturer: [place]   Lot: [id number]   [place]: [id number]

## 2020-02-23 ENCOUNTER — Ambulatory Visit: Payer: Medicare PPO | Attending: Internal Medicine

## 2020-02-23 DIAGNOSIS — Z23 Encounter for immunization: Secondary | ICD-10-CM

## 2020-02-23 NOTE — Progress Notes (Signed)
   Covid-19 Vaccination Clinic  Name:  JANAYA TRIOLA    MRN: [id number] DOB: 1941-08-24  02/23/2020  Ms. Mcmanamon was observed post Covid-19 immunization for 15 minutes without incident. She was provided with Vaccine Information Sheet and instruction to access the V-Safe system.   Ms. Vanbramer was instructed to call 911 with any severe reactions post vaccine: Marland Kitchen Difficulty breathing  . Swelling of face and throat  . A fast heartbeat  . A bad rash all over body  . Dizziness and weakness   Immunizations Administered    Name Date Dose VIS Date Route   Pfizer COVID-19 Vaccine 02/23/2020 11:23 AM 0.3 mL 11/19/2019 Intramuscular   Manufacturer: [place]   Lot: [id number]   [place]: [id number]

## 2020-04-05 DIAGNOSIS — Z86006 Personal history of melanoma in-situ: Secondary | ICD-10-CM | POA: Diagnosis not present

## 2020-04-05 DIAGNOSIS — Z1283 Encounter for screening for malignant neoplasm of skin: Secondary | ICD-10-CM | POA: Diagnosis not present

## 2020-04-05 DIAGNOSIS — Z08 Encounter for follow-up examination after completed treatment for malignant neoplasm: Secondary | ICD-10-CM | POA: Diagnosis not present

## 2020-04-05 DIAGNOSIS — L821 Other seborrheic keratosis: Secondary | ICD-10-CM | POA: Diagnosis not present

## 2020-04-05 DIAGNOSIS — C44319 Basal cell carcinoma of skin of other parts of face: Secondary | ICD-10-CM | POA: Diagnosis not present

## 2020-04-25 DIAGNOSIS — Z08 Encounter for follow-up examination after completed treatment for malignant neoplasm: Secondary | ICD-10-CM | POA: Diagnosis not present

## 2020-04-25 DIAGNOSIS — L720 Epidermal cyst: Secondary | ICD-10-CM | POA: Diagnosis not present

## 2020-04-25 DIAGNOSIS — Z85828 Personal history of other malignant neoplasm of skin: Secondary | ICD-10-CM | POA: Diagnosis not present

## 2020-05-01 ENCOUNTER — Other Ambulatory Visit: Payer: Self-pay

## 2020-05-01 ENCOUNTER — Ambulatory Visit (HOSPITAL_COMMUNITY)
Admission: EM | Admit: 2020-05-01 | Discharge: 2020-05-01 | Disposition: A | Discharge status: Home / Self Care | Payer: Medicare PPO | Attending: Family Medicine | Admitting: Family Medicine

## 2020-05-01 ENCOUNTER — Encounter (HOSPITAL_COMMUNITY): Payer: Self-pay

## 2020-05-01 DIAGNOSIS — R519 Headache, unspecified: Secondary | ICD-10-CM | POA: Diagnosis not present

## 2020-05-01 DIAGNOSIS — L0291 Cutaneous abscess, unspecified: Secondary | ICD-10-CM

## 2020-05-01 MED ORDER — SULFAMETHOXAZOLE-TRIMETHOPRIM 800-160 MG PO TABS: 1.0000 | ORAL_TABLET | Freq: Two times a day (BID) | ORAL | 0 refills | Status: AC

## 2020-05-01 NOTE — ED Provider Notes (Signed)
[facility]   [id number] 05/01/20 Arrival Time: 1350   [id number]  SUBJECTIVE:  Whitney Ray is a 79 y.o. female who presents with a possible abscess of her left lower cheek. Onset gradual, approximately 2 weeks ago. Son reports that she was crying this morning after she hit the area. Son also reports that the area has been bleeding as well today. Reports that they were seen by dermatology in the last 2 weeks and that she has completed a round of antibiotics, but he cannot recall what they were. States that she finished these about 3-4 days ago. Reports that they have a follow up with dermatology tomorrow as well.  Denies fever, chills, HA, ST, n/v/d, rash, other symptoms.  ROS: As per HPI.  All other pertinent ROS negative.     Past Medical History:  Diagnosis Date  . Allergic rhinitis   . Dementia ([place])   . History of brain surgery   . Hypertension   . Metabolic syndrome    elevated bs, elevated cholesterol obese  . Mitral valve prolapse   . PMS (premenstrual syndrome)   . Renal insufficiency   . Sun-damaged skin    chronic  . Urinary incontinence    Past Surgical History:  Procedure Laterality Date  . BRAIN SURGERY    . OPEN REDUCTION INTERNAL FIXATION (ORIF) DISTAL RADIAL FRACTURE Left 01/10/2020   Procedure: OPEN REDUCTION INTERNAL FIXATION (ORIF) DISTAL RADIAL FRACTURE;  Surgeon: Iran Planas, MD;  Location: [facility];  Service: Orthopedics;  Laterality: Left;   Allergies  Allergen Reactions  . Penicillins Hives    *Family and Patient don't remember that she is allergic to this* Has patient had a PCN reaction causing immediate rash, facial/tongue/throat swelling, SOB or lightheadedness with hypotension: Unknown Has patient had a PCN reaction causing severe rash involving mucus membranes or skin necrosis: Unknown Has patient had a PCN reaction that required hospitalization: Unknown Has patient had a PCN reaction occurring within the last 10 years: Unknown If  all of the above answers are "NO",    No current facility-administered medications on file prior to encounter.   Current Outpatient Medications on File Prior to Encounter  Medication Sig Dispense Refill  . cefUROXime (CEFTIN) 250 MG tablet Take 250 mg by mouth 2 (two) times daily with a meal.    . docusate sodium (COLACE) 100 MG capsule Take 1 capsule (100 mg total) by mouth every 12 (twelve) hours. 30 capsule 0  . donepezil (ARICEPT) 10 MG tablet Take 10 mg by mouth at bedtime.    Marland Kitchen LORazepam (ATIVAN) 0.5 MG tablet Take 1 tablet (0.5 mg total) by mouth at bedtime as needed for anxiety or sleep. 4 tablet 0  . Melatonin 5 MG TABS Take 10 mg by mouth at bedtime.    . memantine (NAMENDA) 5 MG tablet Take 5 mg by mouth 2 (two) times daily.    . methocarbamol (ROBAXIN) 500 MG tablet Take 500 mg by mouth 4 (four) times daily as needed for muscle spasms.     Marland Kitchen oxyCODONE-acetaminophen (PERCOCET/ROXICET) 5-325 MG tablet Take 1 tablet by mouth every 6 (six) hours.    . polyethylene glycol (MIRALAX) 17 g packet Take 17 g by mouth daily. 14 each 0  . pravastatin (PRAVACHOL) 20 MG tablet Take 20 mg by mouth daily.    . QUEtiapine (SEROQUEL) 25 MG tablet Take 50 mg by mouth at bedtime.     . SUPER B COMPLEX/C PO Take 1 tablet by mouth daily.  Social History   Socioeconomic History  . Marital status: Married    Spouse name: Not on file  . Number of children: Not on file  . Years of education: Not on file  . Highest education level: Not on file  Occupational History  . Not on file  Tobacco Use  . Smoking status: Never Smoker  . Smokeless tobacco: Never Used  Substance and Sexual Activity  . Alcohol use: No  . Drug use: No  . Sexual activity: Not on file  Other Topics Concern  . Not on file  Social History Narrative  . Not on file   Social Determinants of Health   Financial Resource Strain:   . Difficulty of Paying Living Expenses:   Food Insecurity:   . Worried About Sales executive in the Last Year:   . Arboriculturist in the Last Year:   Transportation Needs:   . Film/video editor (Medical):   Marland Kitchen Lack of Transportation (Non-Medical):   Physical Activity:   . Days of Exercise per Week:   . Minutes of Exercise per Session:   Stress:   . Feeling of Stress :   Social Connections:   . Frequency of Communication with Friends and Family:   . Frequency of Social Gatherings with Friends and Family:   . Attends Religious Services:   . Active Member of Clubs or Organizations:   . Attends Archivist Meetings:   Marland Kitchen Marital Status:   Intimate Partner Violence:   . Fear of Current or Ex-Partner:   . Emotionally Abused:   Marland Kitchen Physically Abused:   . Sexually Abused:    Family History  Family history unknown: Yes    OBJECTIVE:  Vitals:   05/01/20 1458  BP: 131/76  Pulse: 72  Resp: 16  Temp: 98.2 F (36.8 C)  TempSrc: Oral  SpO2: 96%     General appearance: alert; no distress Skin: 2 x 2 cm induration of her left lower cheek; tender to touch; no active drainage Psychological: alert and cooperative; normal mood and affect  Procedure: Verbal consent obtained. Area over induration cleaned with betadine. Lidocaine 2% without epinephrine used to obtain local anesthesia. The most fluctuant portion of the abscess was incised with a #10 blade scalpel. Abscess cavity explored and evacuated.  Dressed with a clean gauze dressing. Minimal bleeding. No complications.  ASSESSMENT & PLAN:  1. Abscess   2. Facial pain     Meds ordered this encounter  Medications  . sulfamethoxazole-trimethoprim (BACTRIM DS) 800-160 MG tablet    Sig: Take 1 tablet by mouth 2 (two) times daily for 7 days.    Dispense:  14 tablet    Refill:  0    Order Specific Question:   Supervising Provider    Answer:   Chase Picket [id number]    Prescribed Bactrim BID x 7 days Abscess with incision and drainage:  Keep dry and covered for next 24-48 hours Warm compresses 3-4x  daily for 10-15 minutes.   You may then wash site daily with warm water and mild soap Keep covered to avoid friction Take antibiotic as prescribed and to completion Return sooner or go to the ED if you have any new or worsening symptoms such as increased redness, swelling, pain, nausea, vomiting, fever, chills.   Reviewed expectations re: course of current medical issues. Questions answered. Outlined signs and symptoms indicating need for more acute intervention. Patient verbalized understanding.  After Visit Summary given.  Faustino Congress, NP 05/01/20 1859

## 2020-05-01 NOTE — Discharge Instructions (Signed)
Keep dry and covered for next 24-48 hours  Warm compresses 3-4x daily for 10-15 minutes.  You may then wash site daily with warm water and mild soap Keep covered to avoid friction  Take antibiotic as prescribed and to completion  Return sooner or go to the ED if you have any new or worsening symptoms such as increased redness, swelling, pain, nausea, vomiting, fever, chills

## 2020-05-01 NOTE — ED Triage Notes (Signed)
Pt presents with abscess on left cheek/neck area for over a week; pt saw PCP a week ago and was prescribed antibiotics but abscess has progressed.

## 2020-05-15 DIAGNOSIS — E7800 Pure hypercholesterolemia, unspecified: Secondary | ICD-10-CM | POA: Diagnosis not present

## 2020-05-15 DIAGNOSIS — R531 Weakness: Secondary | ICD-10-CM | POA: Diagnosis not present

## 2020-05-15 DIAGNOSIS — E538 Deficiency of other specified B group vitamins: Secondary | ICD-10-CM | POA: Diagnosis not present

## 2020-05-15 DIAGNOSIS — N183 Chronic kidney disease, stage 3 unspecified: Secondary | ICD-10-CM | POA: Diagnosis not present

## 2020-05-31 DIAGNOSIS — E878 Other disorders of electrolyte and fluid balance, not elsewhere classified: Secondary | ICD-10-CM | POA: Diagnosis not present

## 2020-06-14 ENCOUNTER — Ambulatory Visit: Payer: Medicare PPO | Admitting: Podiatry

## 2020-06-14 ENCOUNTER — Other Ambulatory Visit: Payer: Self-pay

## 2020-06-20 DIAGNOSIS — L821 Other seborrheic keratosis: Secondary | ICD-10-CM | POA: Diagnosis not present

## 2020-06-20 DIAGNOSIS — D225 Melanocytic nevi of trunk: Secondary | ICD-10-CM | POA: Diagnosis not present

## 2020-06-20 DIAGNOSIS — Z08 Encounter for follow-up examination after completed treatment for malignant neoplasm: Secondary | ICD-10-CM | POA: Diagnosis not present

## 2020-06-20 DIAGNOSIS — D485 Neoplasm of uncertain behavior of skin: Secondary | ICD-10-CM | POA: Diagnosis not present

## 2020-06-20 DIAGNOSIS — Z1283 Encounter for screening for malignant neoplasm of skin: Secondary | ICD-10-CM | POA: Diagnosis not present

## 2020-06-20 DIAGNOSIS — D2262 Melanocytic nevi of left upper limb, including shoulder: Secondary | ICD-10-CM | POA: Diagnosis not present

## 2020-06-20 DIAGNOSIS — Z85820 Personal history of malignant melanoma of skin: Secondary | ICD-10-CM | POA: Diagnosis not present

## 2020-07-10 DIAGNOSIS — G309 Alzheimer's disease, unspecified: Secondary | ICD-10-CM | POA: Diagnosis not present

## 2020-07-10 DIAGNOSIS — Z9889 Other specified postprocedural states: Secondary | ICD-10-CM | POA: Diagnosis not present

## 2020-07-10 DIAGNOSIS — D320 Benign neoplasm of cerebral meninges: Secondary | ICD-10-CM | POA: Diagnosis not present

## 2020-07-10 DIAGNOSIS — Z923 Personal history of irradiation: Secondary | ICD-10-CM | POA: Diagnosis not present

## 2020-07-10 DIAGNOSIS — F0280 Dementia in other diseases classified elsewhere without behavioral disturbance: Secondary | ICD-10-CM | POA: Diagnosis not present

## 2020-09-19 DIAGNOSIS — D485 Neoplasm of uncertain behavior of skin: Secondary | ICD-10-CM | POA: Diagnosis not present

## 2020-09-19 DIAGNOSIS — Z08 Encounter for follow-up examination after completed treatment for malignant neoplasm: Secondary | ICD-10-CM | POA: Diagnosis not present

## 2020-09-19 DIAGNOSIS — L821 Other seborrheic keratosis: Secondary | ICD-10-CM | POA: Diagnosis not present

## 2020-09-19 DIAGNOSIS — D225 Melanocytic nevi of trunk: Secondary | ICD-10-CM | POA: Diagnosis not present

## 2020-09-19 DIAGNOSIS — Z1283 Encounter for screening for malignant neoplasm of skin: Secondary | ICD-10-CM | POA: Diagnosis not present

## 2020-09-19 DIAGNOSIS — Z86006 Personal history of melanoma in-situ: Secondary | ICD-10-CM | POA: Diagnosis not present

## 2021-01-19 DIAGNOSIS — F5101 Primary insomnia: Secondary | ICD-10-CM | POA: Diagnosis not present

## 2021-01-19 DIAGNOSIS — F0390 Unspecified dementia without behavioral disturbance: Secondary | ICD-10-CM | POA: Diagnosis not present

## 2021-01-19 DIAGNOSIS — D329 Benign neoplasm of meninges, unspecified: Secondary | ICD-10-CM | POA: Diagnosis not present

## 2021-01-19 DIAGNOSIS — N183 Chronic kidney disease, stage 3 unspecified: Secondary | ICD-10-CM | POA: Diagnosis not present

## 2021-04-27 DIAGNOSIS — R8290 Unspecified abnormal findings in urine: Secondary | ICD-10-CM | POA: Diagnosis not present

## 2021-04-27 DIAGNOSIS — N390 Urinary tract infection, site not specified: Secondary | ICD-10-CM | POA: Diagnosis not present

## 2022-03-04 DIAGNOSIS — R2231 Localized swelling, mass and lump, right upper limb: Secondary | ICD-10-CM | POA: Diagnosis not present

## 2022-03-04 DIAGNOSIS — R229 Localized swelling, mass and lump, unspecified: Secondary | ICD-10-CM | POA: Diagnosis not present

## 2022-03-04 DIAGNOSIS — R251 Tremor, unspecified: Secondary | ICD-10-CM | POA: Diagnosis not present

## 2022-03-06 ENCOUNTER — Other Ambulatory Visit: Payer: Self-pay | Admitting: Home Modifications

## 2022-03-06 ENCOUNTER — Other Ambulatory Visit: Payer: Self-pay

## 2022-03-06 ENCOUNTER — Emergency Department (HOSPITAL_COMMUNITY): Payer: Medicare PPO

## 2022-03-06 ENCOUNTER — Encounter (HOSPITAL_COMMUNITY): Payer: Self-pay | Admitting: Emergency Medicine

## 2022-03-06 ENCOUNTER — Emergency Department (HOSPITAL_COMMUNITY)
Admission: EM | Admit: 2022-03-06 | Discharge: 2022-03-06 | Disposition: A | Discharge status: Home / Self Care | Payer: Medicare PPO | Attending: Emergency Medicine | Admitting: Emergency Medicine

## 2022-03-06 DIAGNOSIS — F0390 Unspecified dementia without behavioral disturbance: Secondary | ICD-10-CM | POA: Insufficient documentation

## 2022-03-06 DIAGNOSIS — S0081XA Abrasion of other part of head, initial encounter: Secondary | ICD-10-CM | POA: Diagnosis not present

## 2022-03-06 DIAGNOSIS — M50323 Other cervical disc degeneration at C6-C7 level: Secondary | ICD-10-CM | POA: Diagnosis not present

## 2022-03-06 DIAGNOSIS — W19XXXA Unspecified fall, initial encounter: Secondary | ICD-10-CM | POA: Diagnosis not present

## 2022-03-06 DIAGNOSIS — M50321 Other cervical disc degeneration at C4-C5 level: Secondary | ICD-10-CM | POA: Diagnosis not present

## 2022-03-06 DIAGNOSIS — M50322 Other cervical disc degeneration at C5-C6 level: Secondary | ICD-10-CM | POA: Diagnosis not present

## 2022-03-06 DIAGNOSIS — R58 Hemorrhage, not elsewhere classified: Secondary | ICD-10-CM | POA: Diagnosis not present

## 2022-03-06 DIAGNOSIS — R229 Localized swelling, mass and lump, unspecified: Secondary | ICD-10-CM

## 2022-03-06 DIAGNOSIS — S0031XA Abrasion of nose, initial encounter: Secondary | ICD-10-CM | POA: Diagnosis not present

## 2022-03-06 DIAGNOSIS — S0992XA Unspecified injury of nose, initial encounter: Secondary | ICD-10-CM | POA: Diagnosis present

## 2022-03-06 DIAGNOSIS — Z043 Encounter for examination and observation following other accident: Secondary | ICD-10-CM | POA: Diagnosis not present

## 2022-03-06 DIAGNOSIS — S022XXA Fracture of nasal bones, initial encounter for closed fracture: Secondary | ICD-10-CM | POA: Diagnosis not present

## 2022-03-06 LAB — CBC WITH DIFFERENTIAL/PLATELET
Abs Immature Granulocytes: 0.03 10*3/uL (ref 0.00–0.07)
Basophils Absolute: 0.1 10*3/uL (ref 0.0–0.1)
Basophils Relative: 1 %
Eosinophils Absolute: 0.1 10*3/uL (ref 0.0–0.5)
Eosinophils Relative: 1 %
HCT: 43.5 % (ref 36.0–46.0)
Hemoglobin: 14.3 g/dL (ref 12.0–15.0)
Immature Granulocytes: 0 %
Lymphocytes Relative: 11 %
Lymphs Abs: 1.2 10*3/uL (ref 0.7–4.0)
MCH: 30.2 pg (ref 26.0–34.0)
MCHC: 32.9 g/dL (ref 30.0–36.0)
MCV: 91.8 fL (ref 80.0–100.0)
Monocytes Absolute: 0.6 10*3/uL (ref 0.1–1.0)
Monocytes Relative: 6 %
Neutro Abs: 8.5 10*3/uL — ABNORMAL HIGH (ref 1.7–7.7)
Neutrophils Relative %: 81 %
Platelets: 173 10*3/uL (ref 150–400)
RBC: 4.74 MIL/uL (ref 3.87–5.11)
RDW: 13.9 % (ref 11.5–15.5)
WBC: 10.4 10*3/uL (ref 4.0–10.5)
nRBC: 0 % (ref 0.0–0.2)

## 2022-03-06 LAB — BASIC METABOLIC PANEL
Anion gap: 10 (ref 5–15)
BUN: 16 mg/dL (ref 8–23)
CO2: 27 mmol/L (ref 22–32)
Calcium: 9.1 mg/dL (ref 8.9–10.3)
Chloride: 103 mmol/L (ref 98–111)
Creatinine, Ser: 0.89 mg/dL (ref 0.44–1.00)
GFR, Estimated: >60 mL/min (ref >60)
Glucose, Bld: 103 mg/dL — ABNORMAL HIGH (ref 70–99)
Potassium: 3.5 mmol/L (ref 3.5–5.1)
Sodium: 140 mmol/L (ref 135–145)

## 2022-03-06 NOTE — Discharge Instructions (Addendum)
Follow-up with Dr. Janace Hoard regarding your nose fracture.  Follow-up with your primary care doctor regarding your fall.  If you have further falls or other new concerning symptom, come back to ER for reassessment. ? ?For now, do not blow your nose until further instructed by ENT. ?

## 2022-03-06 NOTE — ED Triage Notes (Signed)
Pt BIB EMS from home, fell from chair while sitting. Lives with son with caregivers. Fall was unwitnessed. Denies pain or discomfort. Able to stand and pivot with full weight bearing to wheelchair.  ? ?BP 120/86 ?P 86 ?Spo2 99% ?CBG 124 ?

## 2022-03-06 NOTE — ED Provider Notes (Signed)
?[facility] DEPT ?Provider Note ? ? ?CSN: [id number] ?Arrival date & time: 03/06/22  0756 ? ?  ? ?History ? ?Chief Complaint  ?Patient presents with  ? Fall  ? ? ?Whitney Ray is a 81 y.o. female.  Presented to the emergency room with concern for fall.  Unwitnessed fall.  Patient has dementia, 24/7 caregivers.  Caregiver was in the house but not in the same room when the fall occurred.  Patient is unable to provide any history about the fall itself.  Family noticed patient to have abrasion to her face. No other known injuries.  ? ?HPI ? ?  ? ?Home Medications ?Prior to Admission medications   ?Medication Sig Start Date End Date Taking? Authorizing Provider  ?cefUROXime (CEFTIN) 250 MG tablet Take 250 mg by mouth 2 (two) times daily with a meal.    [provider]  ?docusate sodium (COLACE) 100 MG capsule Take 1 capsule (100 mg total) by mouth every 12 (twelve) hours. 01/20/20   Fawze, Mina A, PA-C  ?donepezil (ARICEPT) 10 MG tablet Take 10 mg by mouth at bedtime.    [provider]  ?LORazepam (ATIVAN) 0.5 MG tablet Take 1 tablet (0.5 mg total) by mouth at bedtime as needed for anxiety or sleep. 01/20/20   Renita Papa, PA-C  ?Melatonin 5 MG TABS Take 10 mg by mouth at bedtime.    [provider]  ?memantine (NAMENDA) 5 MG tablet Take 5 mg by mouth 2 (two) times daily. 12/26/19   [provider]  ?methocarbamol (ROBAXIN) 500 MG tablet Take 500 mg by mouth 4 (four) times daily as needed for muscle spasms.  01/10/20   [provider]  ?oxyCODONE-acetaminophen (PERCOCET/ROXICET) 5-325 MG tablet Take 1 tablet by mouth every 6 (six) hours. 01/10/20   [provider]  ?polyethylene glycol (MIRALAX) 17 g packet Take 17 g by mouth daily. 01/20/20   Fawze, Mina A, PA-C  ?pravastatin (PRAVACHOL) 20 MG tablet Take 20 mg by mouth daily.    [provider]  ?QUEtiapine (SEROQUEL) 25 MG tablet Take 50 mg by mouth at bedtime.  02/03/19    [provider]  ?SUPER B COMPLEX/C PO Take 1 tablet by mouth daily.    [provider]  ?   ? ?Allergies    ?Penicillins   ? ?Review of Systems   ?Review of Systems  ?Unable to perform ROS: Dementia  ? ?Physical Exam ?Updated Vital Signs ?BP (!) 158/71 (BP Location: Left Arm)   Pulse 67   Temp (!) 97.5 ?F (36.4 ?C) (Oral)   Resp 18   SpO2 100%  ?Physical Exam ?Vitals and nursing note reviewed.  ?Constitutional:   ?   General: She is not in acute distress. ?   Appearance: She is well-developed.  ?HENT:  ?   Head: Normocephalic.  ?   Comments: Abrasion to forehead, nose, no obvious nasal deformity noted ?Eyes:  ?   Conjunctiva/sclera: Conjunctivae normal.  ?Cardiovascular:  ?   Rate and Rhythm: Normal rate and regular rhythm.  ?   Heart sounds: No murmur heard. ?Pulmonary:  ?   Effort: Pulmonary effort is normal. No respiratory distress.  ?   Breath sounds: Normal breath sounds.  ?Abdominal:  ?   Palpations: Abdomen is soft.  ?   Tenderness: There is no abdominal tenderness.  ?Musculoskeletal:     ?   General: No swelling.  ?   Cervical back: Neck supple.  ?  Comments: Back: no C, T, L spine TTP, no step off or deformity ?RUE: no TTP throughout, no deformity, normal joint ROM, radial pulse intact, distal sensation and motor intact ?LUE: no TTP throughout, no deformity, normal joint ROM, radial pulse intact, distal sensation and motor intact ?RLE:  no TTP throughout, no deformity, normal joint ROM, distal pulse, sensation and motor intact ?LLE: no TTP throughout, no deformity, normal joint ROM, distal pulse, sensation and motor intact  ?Skin: ?   General: Skin is warm and dry.  ?   Capillary Refill: Capillary refill takes less than 2 seconds.  ?Neurological:  ?   Mental Status: She is alert.  ?   Comments: Alert, oriented to self but not time or place  ?Psychiatric:     ?   Mood and Affect: Mood normal.  ? ? ?ED Results / Procedures / Treatments   ?Labs ?(all labs ordered are listed, but only  abnormal results are displayed) ?Labs Reviewed  ?CBC WITH DIFFERENTIAL/PLATELET - Abnormal; Notable for the following components:  ?    Result Value  ? Neutro Abs 8.5 (*)   ? All other components within normal limits  ?BASIC METABOLIC PANEL - Abnormal; Notable for the following components:  ? Glucose, Bld 103 (*)   ? All other components within normal limits  ?URINALYSIS, ROUTINE W REFLEX MICROSCOPIC  ? ? ?EKG ?None ? ?Radiology ?DG Chest 1 View ? ?Result Date: 03/06/2022 ?CLINICAL DATA:  Unwitnessed fall. EXAM: CHEST  1 VIEW COMPARISON:  AP chest 01/02/2020 FINDINGS: Cardiac silhouette and mediastinal contours are within normal limits. A vertical skin fold overlies the inferolateral left lung. There appears to be normal pulmonary vasculature extending lateral to this and therefore this does not appear to represent a pneumothorax. The skin fold is contiguous with the contour of more inferior breast tissue. The lungs are clear. No pleural effusion or pneumothorax. No acute skeletal abnormality. IMPRESSION: No active disease. Electronically Signed   By: Yvonne Kendall M.D.   On: 03/06/2022 10:19  ? ?DG Pelvis 1-2 Views ? ?Result Date: 03/06/2022 ?CLINICAL DATA:  Unwitnessed fall. EXAM: PELVIS - 1-2 VIEW COMPARISON:  None. FINDINGS: Single AP view. The mineralization and alignment are normal. There is no evidence of acute fracture or dislocation. The joint spaces appear preserved. No focal soft tissue abnormalities are apparent. The visualized bowel gas pattern is normal. IMPRESSION: No evidence of acute pelvic fracture or dislocation. Electronically Signed   By: Richardean Sale M.D.   On: 03/06/2022 10:17  ? ?CT Head Wo Contrast ? ?Result Date: 03/06/2022 ?CLINICAL DATA:  Patient fell from a chair. Unwitnessed fall. Abrasion to frontal scalp and nasal area. EXAM: CT HEAD WITHOUT CONTRAST CT MAXILLOFACIAL WITHOUT CONTRAST CT CERVICAL SPINE WITHOUT CONTRAST TECHNIQUE: Multidetector CT imaging of the head, cervical spine,  and maxillofacial structures were performed using the standard protocol without intravenous contrast. Multiplanar CT image reconstructions of the cervical spine and maxillofacial structures were also generated. RADIATION DOSE REDUCTION: This exam was performed according to the departmental dose-optimization program which includes automated exposure control, adjustment of the mA and/or kV according to patient size and/or use of iterative reconstruction technique. COMPARISON:  Head and cervical spine CT 01/02/2020 FINDINGS: CT HEAD FINDINGS Brain: No evidence for an acute hemorrhage. No hydrocephalus or abnormal extra-axial fluid collection. 2.1 x 1.4 cm hyperattenuating lesion in the right posterior fossa compatible with meningioma better characterized on MRI of the brain 04/18/2019 without appreciable enlargement. No mass effect or adjacent edema on today's  study. Diffuse loss of parenchymal volume is consistent with atrophy. Patchy low attenuation in the deep hemispheric and periventricular white matter is nonspecific, but likely reflects chronic microvascular ischemic demyelination. Vascular: No hyperdense vessel or unexpected calcification. Skull: No evidence for fracture. No worrisome lytic or sclerotic lesion. Other: None. CT MAXILLOFACIAL FINDINGS Osseous: Minimally displaced bilateral nasal bone fractures evident. No maxillary sinus or zygomatic arch fracture. No mandible fracture. Temporomandibular joints are located. Orbits: Negative. No traumatic or inflammatory finding. Sinuses: Clear. Soft tissues: Negative. CT CERVICAL SPINE FINDINGS Alignment: Normal. Skull base and vertebrae: No acute fracture. No primary bone lesion or focal pathologic process. Soft tissues and spinal canal: No prevertebral fluid or swelling. No visible canal hematoma. Disc levels: Marked loss of disc height noted at C4-5, C5-6, and C6-7 with endplate degeneration. The facets are well aligned bilaterally. Upper chest: Unremarkable.  Other: None. IMPRESSION: 1. No acute intracranial abnormality. 2. Minimally displaced bilateral nasal bone fractures. 3. Right posterior fossa meningioma stable since brain MRI 04/18/2019. 4. No acute cervical spine f

## 2022-03-07 ENCOUNTER — Ambulatory Visit
Admission: RE | Admit: 2022-03-07 | Discharge: 2022-03-07 | Disposition: A | Discharge status: Home / Self Care | Payer: Medicare PPO | Source: Ambulatory Visit | Attending: Home Modifications | Admitting: Home Modifications

## 2022-03-07 DIAGNOSIS — R222 Localized swelling, mass and lump, trunk: Secondary | ICD-10-CM | POA: Diagnosis not present

## 2022-03-07 DIAGNOSIS — R229 Localized swelling, mass and lump, unspecified: Secondary | ICD-10-CM

## 2022-03-13 DIAGNOSIS — Z0000 Encounter for general adult medical examination without abnormal findings: Secondary | ICD-10-CM | POA: Diagnosis not present

## 2022-03-13 DIAGNOSIS — N183 Chronic kidney disease, stage 3 unspecified: Secondary | ICD-10-CM | POA: Diagnosis not present

## 2022-03-13 DIAGNOSIS — E7800 Pure hypercholesterolemia, unspecified: Secondary | ICD-10-CM | POA: Diagnosis not present

## 2022-03-13 DIAGNOSIS — F0390 Unspecified dementia without behavioral disturbance: Secondary | ICD-10-CM | POA: Diagnosis not present

## 2022-03-13 DIAGNOSIS — F5101 Primary insomnia: Secondary | ICD-10-CM | POA: Diagnosis not present

## 2022-03-13 DIAGNOSIS — E538 Deficiency of other specified B group vitamins: Secondary | ICD-10-CM | POA: Diagnosis not present

## 2022-09-23 DIAGNOSIS — L304 Erythema intertrigo: Secondary | ICD-10-CM | POA: Diagnosis not present

## 2023-03-03 DIAGNOSIS — F0390 Unspecified dementia without behavioral disturbance: Secondary | ICD-10-CM | POA: Diagnosis not present

## 2023-03-03 DIAGNOSIS — E7800 Pure hypercholesterolemia, unspecified: Secondary | ICD-10-CM | POA: Diagnosis not present

## 2023-03-03 DIAGNOSIS — D329 Benign neoplasm of meninges, unspecified: Secondary | ICD-10-CM | POA: Diagnosis not present

## 2023-03-03 DIAGNOSIS — R8290 Unspecified abnormal findings in urine: Secondary | ICD-10-CM | POA: Diagnosis not present

## 2023-03-03 DIAGNOSIS — N1831 Chronic kidney disease, stage 3a: Secondary | ICD-10-CM | POA: Diagnosis not present

## 2023-10-29 IMAGING — CR DG PELVIS 1-2V
1 series · 1 of 1 positions shown · non-contrast
Comparison: None.

CLINICAL DATA: Unwitnessed fall.

EXAM:
PELVIS - 1-2 VIEW

[x pelvis]
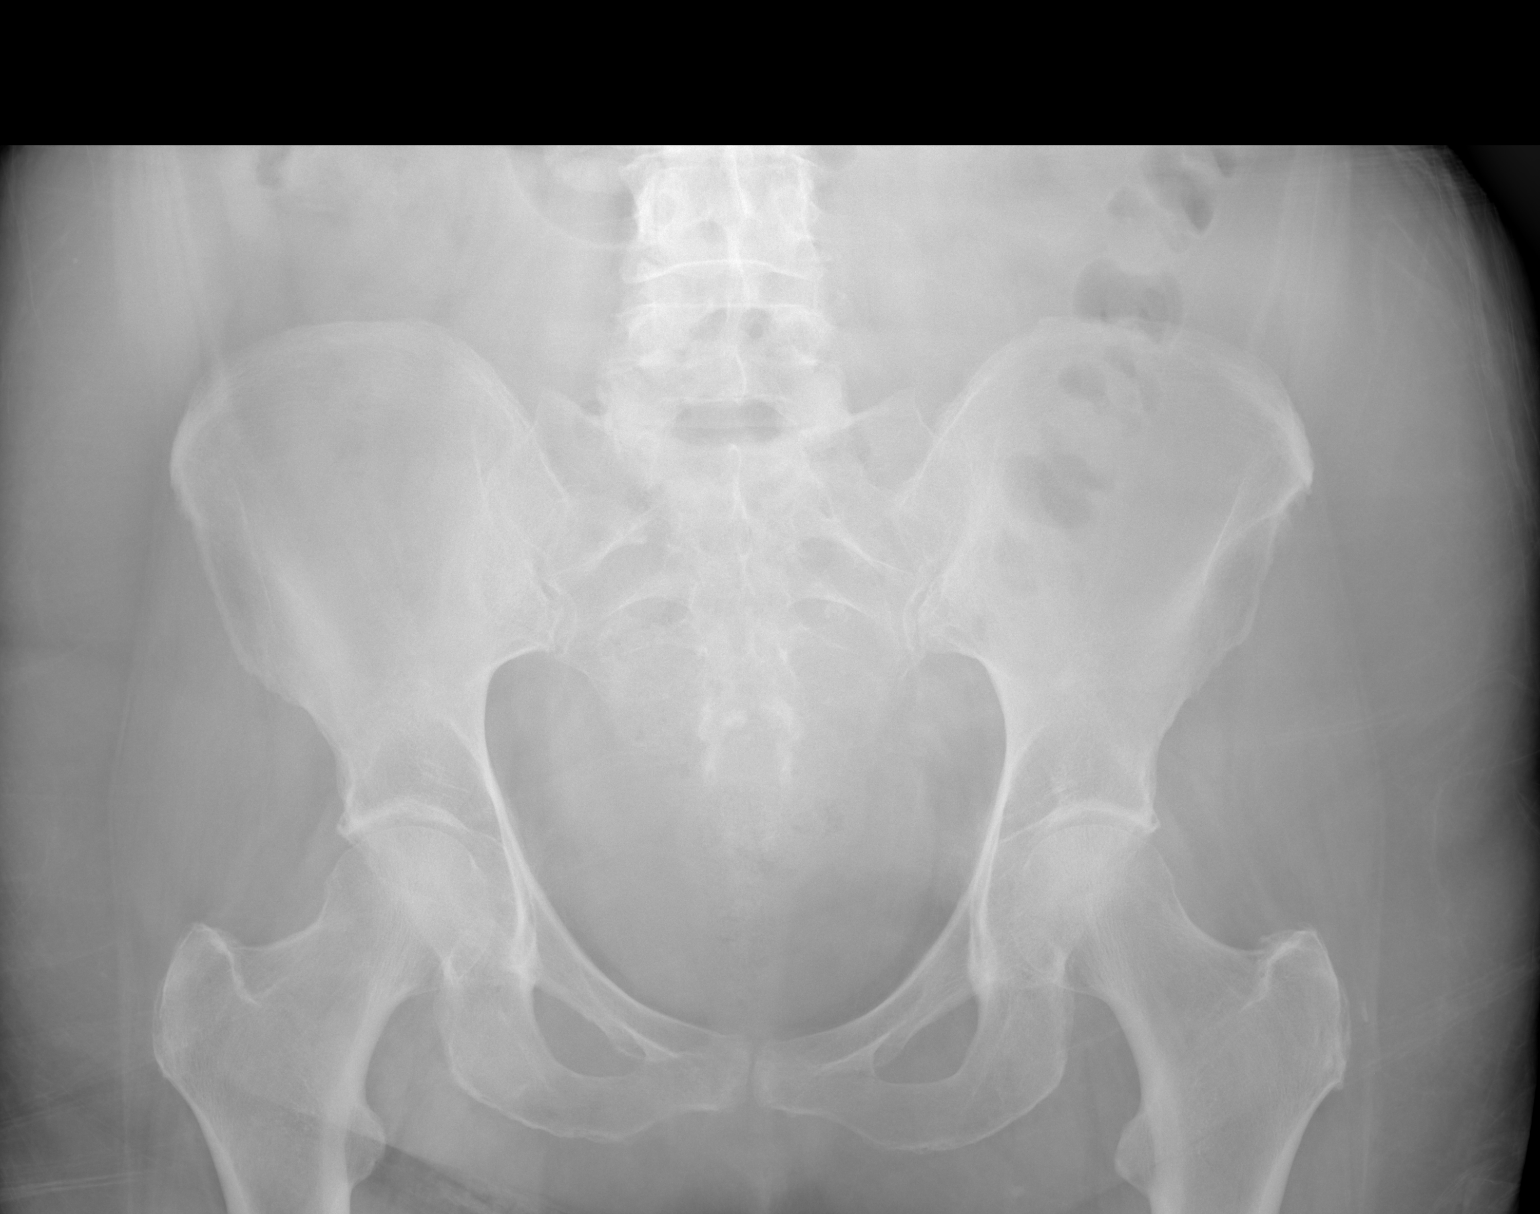

[1 of 1 positions shown; findings below may reference images not displayed]

FINDINGS: Single AP view. The mineralization and alignment are normal. There
is no evidence of acute fracture or dislocation. The joint spaces
appear preserved. No focal soft tissue abnormalities are apparent.
The visualized bowel gas pattern is normal.
IMPRESSION: No evidence of acute pelvic fracture or dislocation.

## 2023-10-30 IMAGING — US US SOFT TISSUE
1 series · 14 of 15 positions shown · non-contrast
Comparison: None

CLINICAL DATA: Soft tissue mass of the right lateral chest wall.

EXAM:
ULTRASOUND OF CHEST SOFT TISSUES
TECHNIQUE: Ultrasound examination of the chest wall soft tissues was performed
in the area of clinical concern.

[Series 1: us soft tissue · 0.09mm/px · 15 acquisitions, 14 frames shown]
[im 1/15]
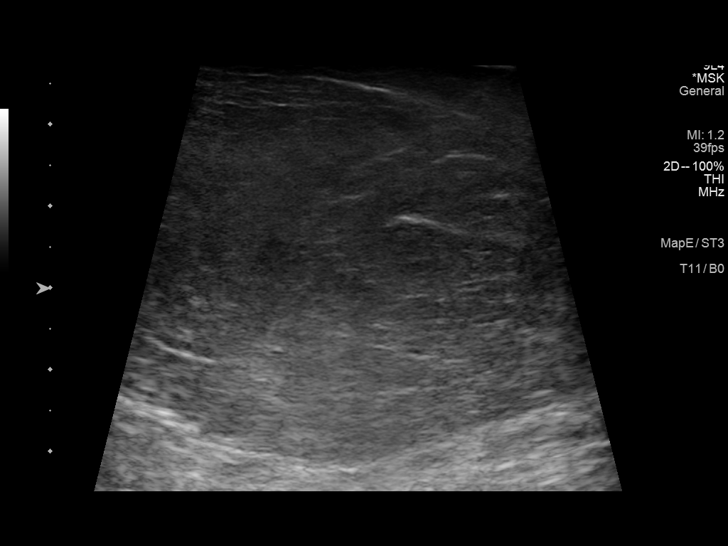
[im 2/15]
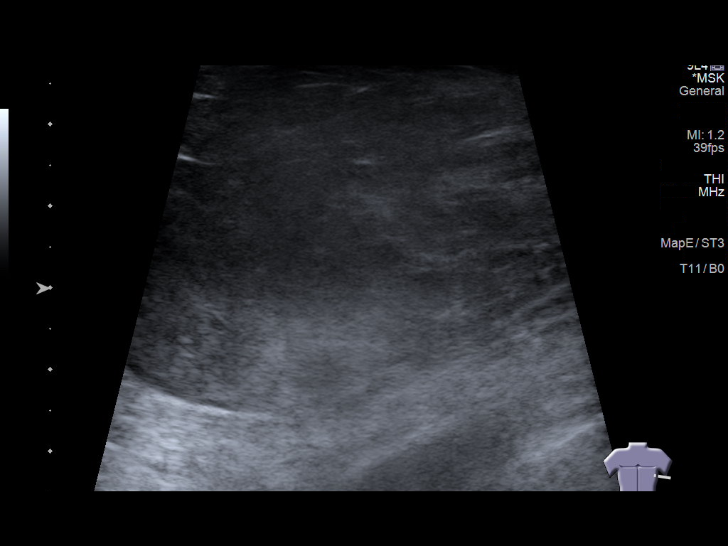
[im 3/15]
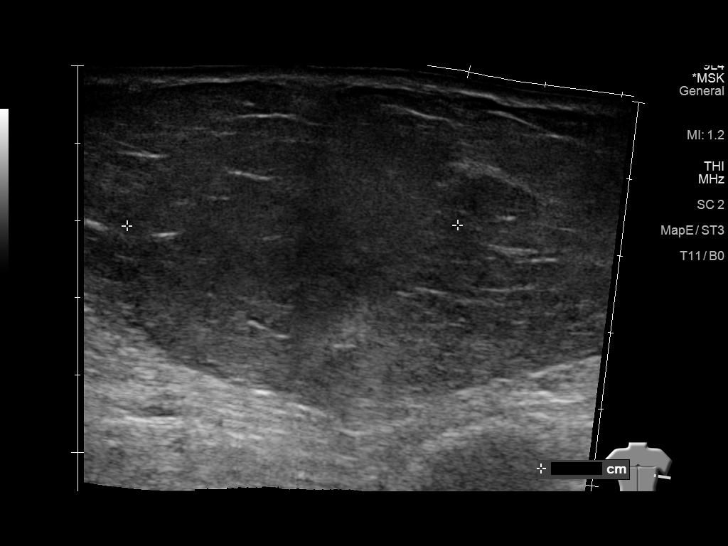
[im 4/15]
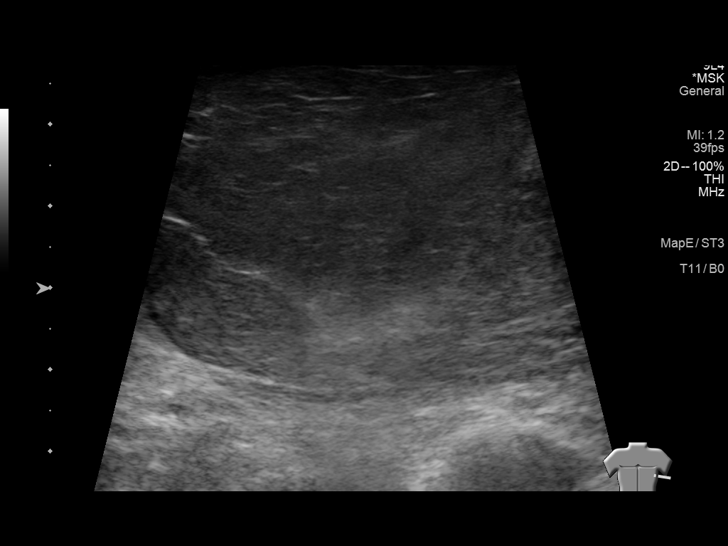
[im 5/15]
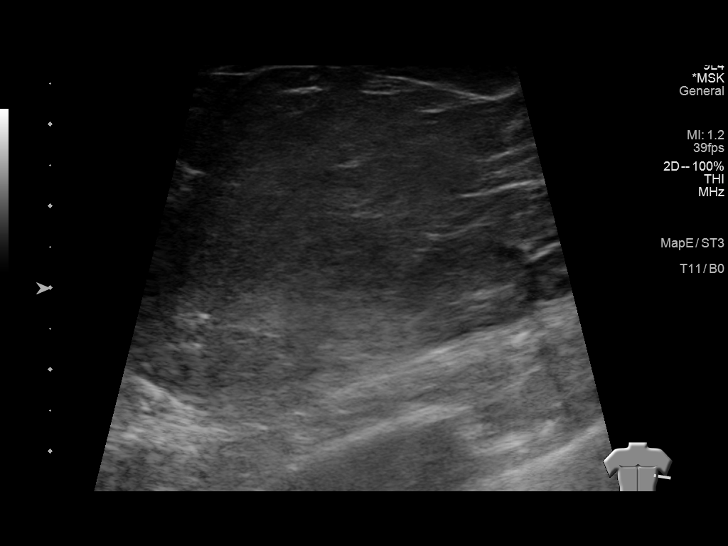
[im 6/15]
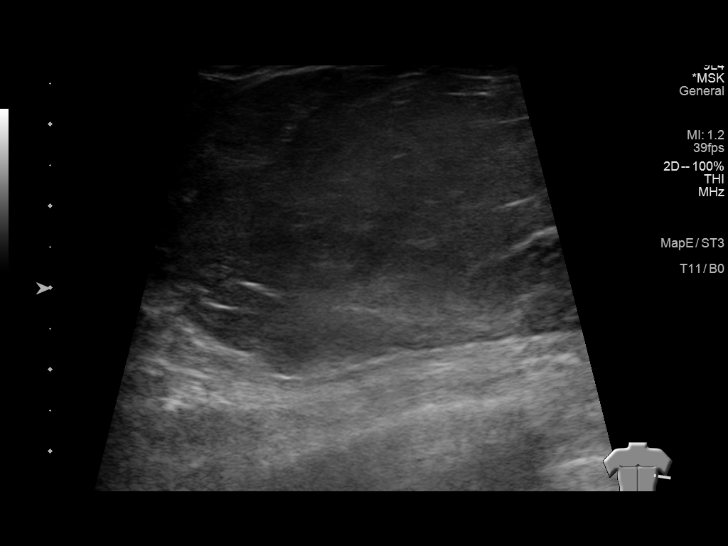
[im 7/15]
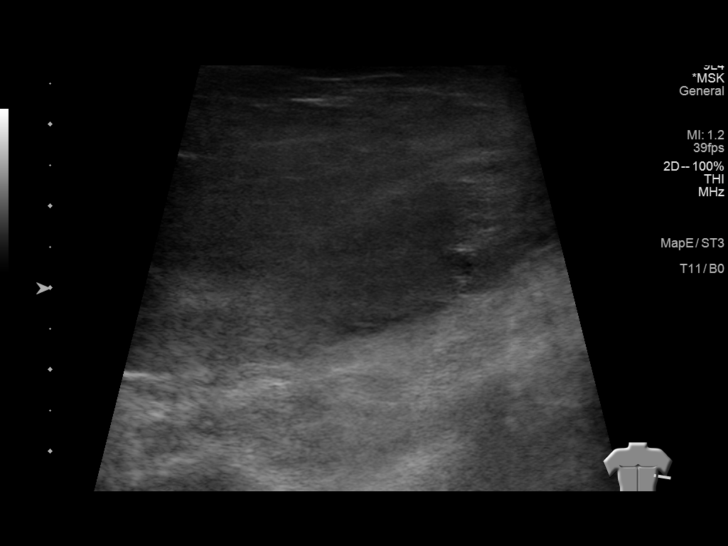
[im 9/15]
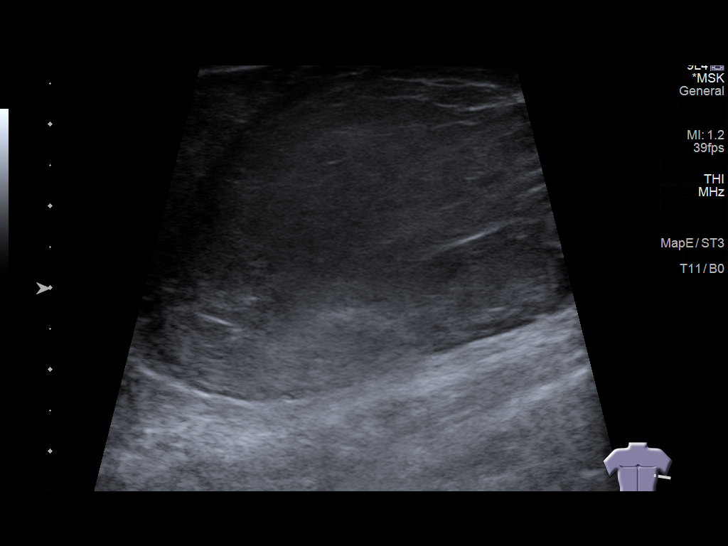
[im 10/15]
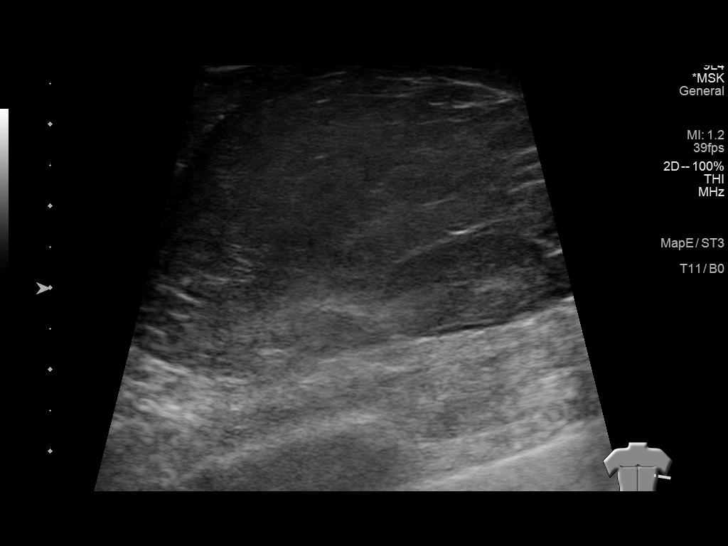
[im 11/15]
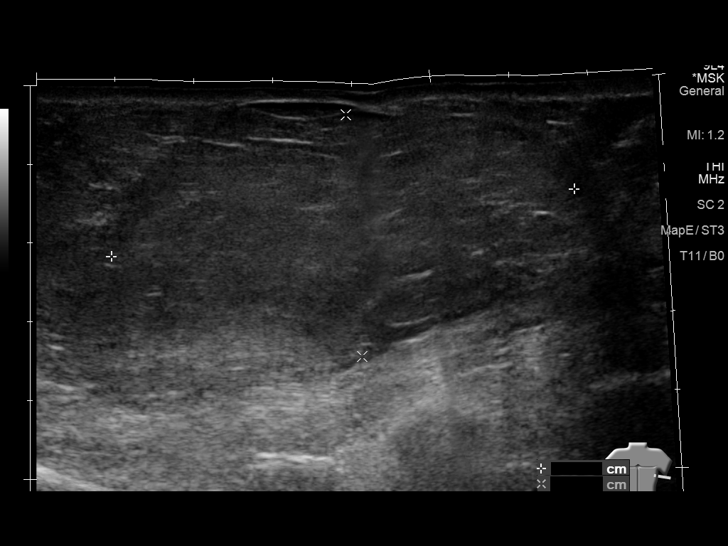
[im 12/15]
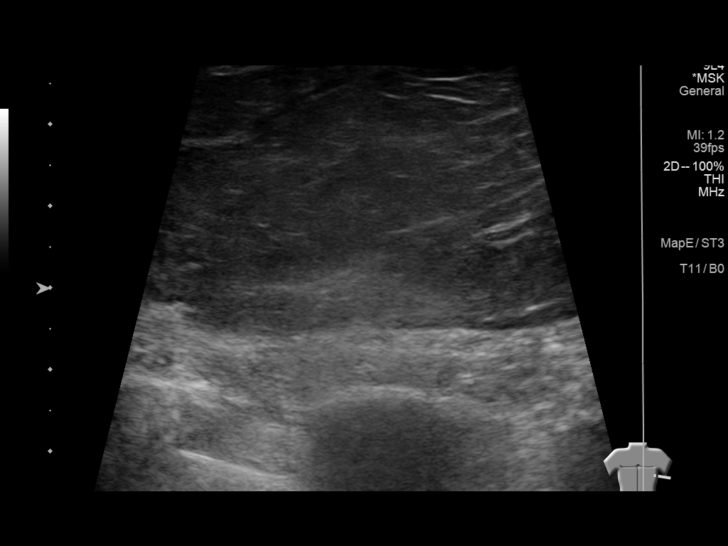
[im 13/15]
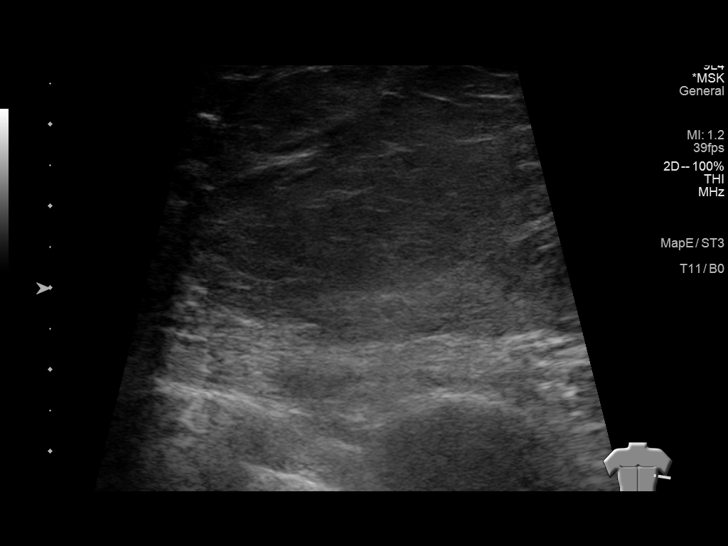
[im 14/15]
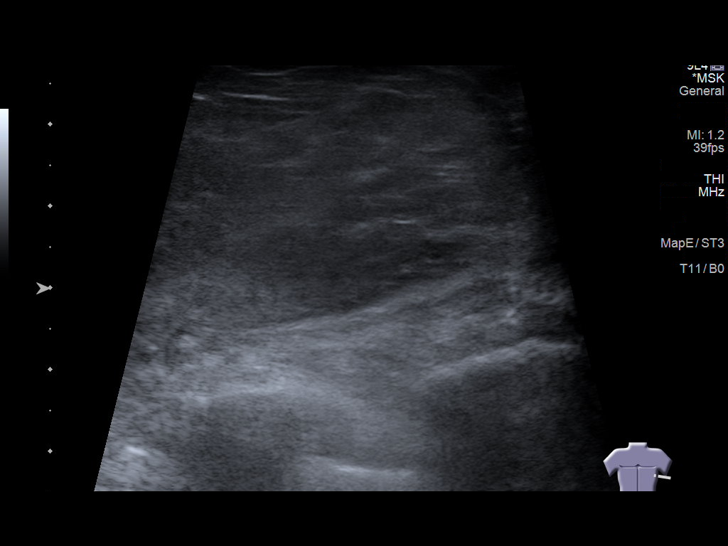
[im 15/15]
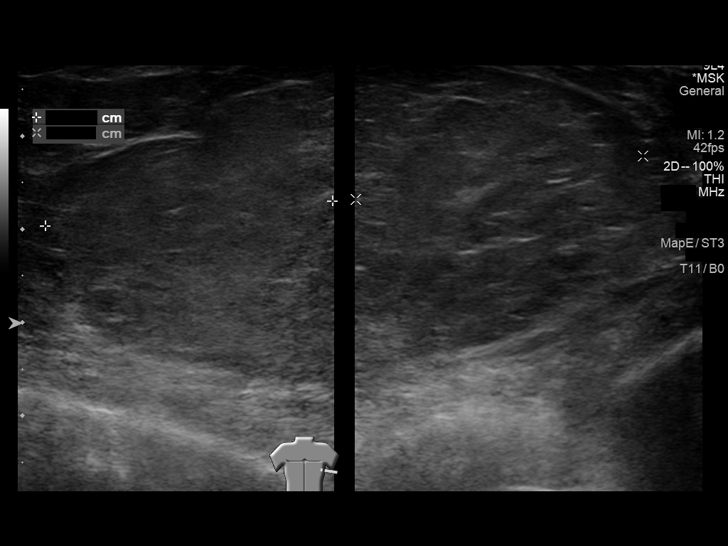

[14 of 15 positions shown; findings below may reference images not displayed]

FINDINGS: A well-defined isoechoic lesion is present within the subcutaneous
soft tissues measuring 6 x 3 x 4 cm. No increased vascularity is
present.

The lesion is reported as deformable.
IMPRESSION: Probable lipoma of the subcutaneous soft tissues measuring 6 x 3 x 4
cm. Lesion has benign characteristics.

## 2023-11-05 DIAGNOSIS — F5101 Primary insomnia: Secondary | ICD-10-CM | POA: Diagnosis not present

## 2023-11-05 DIAGNOSIS — E7800 Pure hypercholesterolemia, unspecified: Secondary | ICD-10-CM | POA: Diagnosis not present

## 2023-11-05 DIAGNOSIS — R0989 Other specified symptoms and signs involving the circulatory and respiratory systems: Secondary | ICD-10-CM | POA: Diagnosis not present

## 2023-11-05 DIAGNOSIS — Z741 Need for assistance with personal care: Secondary | ICD-10-CM | POA: Diagnosis not present

## 2023-11-05 DIAGNOSIS — R531 Weakness: Secondary | ICD-10-CM | POA: Diagnosis not present

## 2023-11-05 DIAGNOSIS — F0390 Unspecified dementia without behavioral disturbance: Secondary | ICD-10-CM | POA: Diagnosis not present

## 2023-11-08 DIAGNOSIS — Z79899 Other long term (current) drug therapy: Secondary | ICD-10-CM | POA: Diagnosis not present

## 2023-11-08 DIAGNOSIS — F03B18 Unspecified dementia, moderate, with other behavioral disturbance: Secondary | ICD-10-CM | POA: Diagnosis not present

## 2023-11-08 DIAGNOSIS — E7800 Pure hypercholesterolemia, unspecified: Secondary | ICD-10-CM | POA: Diagnosis not present

## 2023-11-08 DIAGNOSIS — F5101 Primary insomnia: Secondary | ICD-10-CM | POA: Diagnosis not present

## 2023-11-08 DIAGNOSIS — Z556 Problems related to health literacy: Secondary | ICD-10-CM | POA: Diagnosis not present

## 2023-11-08 DIAGNOSIS — R0989 Other specified symptoms and signs involving the circulatory and respiratory systems: Secondary | ICD-10-CM | POA: Diagnosis not present

## 2023-11-08 DIAGNOSIS — Z87891 Personal history of nicotine dependence: Secondary | ICD-10-CM | POA: Diagnosis not present

## 2023-11-08 DIAGNOSIS — Z8781 Personal history of (healed) traumatic fracture: Secondary | ICD-10-CM | POA: Diagnosis not present

## 2023-11-11 DIAGNOSIS — Z79899 Other long term (current) drug therapy: Secondary | ICD-10-CM | POA: Diagnosis not present

## 2023-11-11 DIAGNOSIS — Z87891 Personal history of nicotine dependence: Secondary | ICD-10-CM | POA: Diagnosis not present

## 2023-11-11 DIAGNOSIS — R0989 Other specified symptoms and signs involving the circulatory and respiratory systems: Secondary | ICD-10-CM | POA: Diagnosis not present

## 2023-11-11 DIAGNOSIS — Z556 Problems related to health literacy: Secondary | ICD-10-CM | POA: Diagnosis not present

## 2023-11-11 DIAGNOSIS — Z8781 Personal history of (healed) traumatic fracture: Secondary | ICD-10-CM | POA: Diagnosis not present

## 2023-11-11 DIAGNOSIS — E7800 Pure hypercholesterolemia, unspecified: Secondary | ICD-10-CM | POA: Diagnosis not present

## 2023-11-11 DIAGNOSIS — F5101 Primary insomnia: Secondary | ICD-10-CM | POA: Diagnosis not present

## 2023-11-11 DIAGNOSIS — F03B18 Unspecified dementia, moderate, with other behavioral disturbance: Secondary | ICD-10-CM | POA: Diagnosis not present

## 2023-11-14 DIAGNOSIS — F03B18 Unspecified dementia, moderate, with other behavioral disturbance: Secondary | ICD-10-CM | POA: Diagnosis not present

## 2023-11-14 DIAGNOSIS — Z556 Problems related to health literacy: Secondary | ICD-10-CM | POA: Diagnosis not present

## 2023-11-14 DIAGNOSIS — E7800 Pure hypercholesterolemia, unspecified: Secondary | ICD-10-CM | POA: Diagnosis not present

## 2023-11-14 DIAGNOSIS — R0989 Other specified symptoms and signs involving the circulatory and respiratory systems: Secondary | ICD-10-CM | POA: Diagnosis not present

## 2023-11-14 DIAGNOSIS — Z8781 Personal history of (healed) traumatic fracture: Secondary | ICD-10-CM | POA: Diagnosis not present

## 2023-11-14 DIAGNOSIS — Z79899 Other long term (current) drug therapy: Secondary | ICD-10-CM | POA: Diagnosis not present

## 2023-11-14 DIAGNOSIS — Z87891 Personal history of nicotine dependence: Secondary | ICD-10-CM | POA: Diagnosis not present

## 2023-11-14 DIAGNOSIS — F5101 Primary insomnia: Secondary | ICD-10-CM | POA: Diagnosis not present

## 2023-11-17 DIAGNOSIS — F0390 Unspecified dementia without behavioral disturbance: Secondary | ICD-10-CM | POA: Diagnosis not present

## 2023-11-17 DIAGNOSIS — R531 Weakness: Secondary | ICD-10-CM | POA: Diagnosis not present

## 2023-11-17 DIAGNOSIS — E7800 Pure hypercholesterolemia, unspecified: Secondary | ICD-10-CM | POA: Diagnosis not present

## 2023-11-20 DIAGNOSIS — Z8781 Personal history of (healed) traumatic fracture: Secondary | ICD-10-CM | POA: Diagnosis not present

## 2023-11-20 DIAGNOSIS — E7800 Pure hypercholesterolemia, unspecified: Secondary | ICD-10-CM | POA: Diagnosis not present

## 2023-11-20 DIAGNOSIS — Z556 Problems related to health literacy: Secondary | ICD-10-CM | POA: Diagnosis not present

## 2023-11-20 DIAGNOSIS — F5101 Primary insomnia: Secondary | ICD-10-CM | POA: Diagnosis not present

## 2023-11-20 DIAGNOSIS — Z79899 Other long term (current) drug therapy: Secondary | ICD-10-CM | POA: Diagnosis not present

## 2023-11-20 DIAGNOSIS — R0989 Other specified symptoms and signs involving the circulatory and respiratory systems: Secondary | ICD-10-CM | POA: Diagnosis not present

## 2023-11-20 DIAGNOSIS — F03B18 Unspecified dementia, moderate, with other behavioral disturbance: Secondary | ICD-10-CM | POA: Diagnosis not present

## 2023-11-20 DIAGNOSIS — Z87891 Personal history of nicotine dependence: Secondary | ICD-10-CM | POA: Diagnosis not present

## 2023-11-24 DIAGNOSIS — E7800 Pure hypercholesterolemia, unspecified: Secondary | ICD-10-CM | POA: Diagnosis not present

## 2023-11-24 DIAGNOSIS — F03B18 Unspecified dementia, moderate, with other behavioral disturbance: Secondary | ICD-10-CM | POA: Diagnosis not present

## 2023-11-24 DIAGNOSIS — Z79899 Other long term (current) drug therapy: Secondary | ICD-10-CM | POA: Diagnosis not present

## 2023-11-24 DIAGNOSIS — F5101 Primary insomnia: Secondary | ICD-10-CM | POA: Diagnosis not present

## 2023-11-24 DIAGNOSIS — R0989 Other specified symptoms and signs involving the circulatory and respiratory systems: Secondary | ICD-10-CM | POA: Diagnosis not present

## 2023-11-24 DIAGNOSIS — Z87891 Personal history of nicotine dependence: Secondary | ICD-10-CM | POA: Diagnosis not present

## 2023-11-24 DIAGNOSIS — Z8781 Personal history of (healed) traumatic fracture: Secondary | ICD-10-CM | POA: Diagnosis not present

## 2023-11-24 DIAGNOSIS — Z556 Problems related to health literacy: Secondary | ICD-10-CM | POA: Diagnosis not present

## 2023-11-27 DIAGNOSIS — Z8781 Personal history of (healed) traumatic fracture: Secondary | ICD-10-CM | POA: Diagnosis not present

## 2023-11-27 DIAGNOSIS — E7800 Pure hypercholesterolemia, unspecified: Secondary | ICD-10-CM | POA: Diagnosis not present

## 2023-11-27 DIAGNOSIS — Z79899 Other long term (current) drug therapy: Secondary | ICD-10-CM | POA: Diagnosis not present

## 2023-11-27 DIAGNOSIS — F5101 Primary insomnia: Secondary | ICD-10-CM | POA: Diagnosis not present

## 2023-11-27 DIAGNOSIS — R0989 Other specified symptoms and signs involving the circulatory and respiratory systems: Secondary | ICD-10-CM | POA: Diagnosis not present

## 2023-11-27 DIAGNOSIS — Z87891 Personal history of nicotine dependence: Secondary | ICD-10-CM | POA: Diagnosis not present

## 2023-11-27 DIAGNOSIS — F03B18 Unspecified dementia, moderate, with other behavioral disturbance: Secondary | ICD-10-CM | POA: Diagnosis not present

## 2023-11-27 DIAGNOSIS — Z556 Problems related to health literacy: Secondary | ICD-10-CM | POA: Diagnosis not present

## 2023-12-06 DIAGNOSIS — Z556 Problems related to health literacy: Secondary | ICD-10-CM | POA: Diagnosis not present

## 2023-12-06 DIAGNOSIS — Z87891 Personal history of nicotine dependence: Secondary | ICD-10-CM | POA: Diagnosis not present

## 2023-12-06 DIAGNOSIS — E7800 Pure hypercholesterolemia, unspecified: Secondary | ICD-10-CM | POA: Diagnosis not present

## 2023-12-06 DIAGNOSIS — Z8781 Personal history of (healed) traumatic fracture: Secondary | ICD-10-CM | POA: Diagnosis not present

## 2023-12-06 DIAGNOSIS — F03B18 Unspecified dementia, moderate, with other behavioral disturbance: Secondary | ICD-10-CM | POA: Diagnosis not present

## 2023-12-06 DIAGNOSIS — R0989 Other specified symptoms and signs involving the circulatory and respiratory systems: Secondary | ICD-10-CM | POA: Diagnosis not present

## 2023-12-06 DIAGNOSIS — Z79899 Other long term (current) drug therapy: Secondary | ICD-10-CM | POA: Diagnosis not present

## 2023-12-06 DIAGNOSIS — F5101 Primary insomnia: Secondary | ICD-10-CM | POA: Diagnosis not present

## 2023-12-09 DIAGNOSIS — R0989 Other specified symptoms and signs involving the circulatory and respiratory systems: Secondary | ICD-10-CM | POA: Diagnosis not present

## 2023-12-09 DIAGNOSIS — E7800 Pure hypercholesterolemia, unspecified: Secondary | ICD-10-CM | POA: Diagnosis not present

## 2023-12-09 DIAGNOSIS — Z556 Problems related to health literacy: Secondary | ICD-10-CM | POA: Diagnosis not present

## 2023-12-09 DIAGNOSIS — Z8781 Personal history of (healed) traumatic fracture: Secondary | ICD-10-CM | POA: Diagnosis not present

## 2023-12-09 DIAGNOSIS — F5101 Primary insomnia: Secondary | ICD-10-CM | POA: Diagnosis not present

## 2023-12-09 DIAGNOSIS — Z79899 Other long term (current) drug therapy: Secondary | ICD-10-CM | POA: Diagnosis not present

## 2023-12-09 DIAGNOSIS — F03B18 Unspecified dementia, moderate, with other behavioral disturbance: Secondary | ICD-10-CM | POA: Diagnosis not present

## 2023-12-09 DIAGNOSIS — Z87891 Personal history of nicotine dependence: Secondary | ICD-10-CM | POA: Diagnosis not present

## 2023-12-10 DIAGNOSIS — Z79899 Other long term (current) drug therapy: Secondary | ICD-10-CM | POA: Diagnosis not present

## 2023-12-10 DIAGNOSIS — F5101 Primary insomnia: Secondary | ICD-10-CM | POA: Diagnosis not present

## 2023-12-10 DIAGNOSIS — R0989 Other specified symptoms and signs involving the circulatory and respiratory systems: Secondary | ICD-10-CM | POA: Diagnosis not present

## 2023-12-10 DIAGNOSIS — Z556 Problems related to health literacy: Secondary | ICD-10-CM | POA: Diagnosis not present

## 2023-12-10 DIAGNOSIS — Z8781 Personal history of (healed) traumatic fracture: Secondary | ICD-10-CM | POA: Diagnosis not present

## 2023-12-10 DIAGNOSIS — Z87891 Personal history of nicotine dependence: Secondary | ICD-10-CM | POA: Diagnosis not present

## 2023-12-10 DIAGNOSIS — E7800 Pure hypercholesterolemia, unspecified: Secondary | ICD-10-CM | POA: Diagnosis not present

## 2023-12-10 DIAGNOSIS — F03B18 Unspecified dementia, moderate, with other behavioral disturbance: Secondary | ICD-10-CM | POA: Diagnosis not present

## 2023-12-13 DIAGNOSIS — Z87891 Personal history of nicotine dependence: Secondary | ICD-10-CM | POA: Diagnosis not present

## 2023-12-13 DIAGNOSIS — F5101 Primary insomnia: Secondary | ICD-10-CM | POA: Diagnosis not present

## 2023-12-13 DIAGNOSIS — E7800 Pure hypercholesterolemia, unspecified: Secondary | ICD-10-CM | POA: Diagnosis not present

## 2023-12-13 DIAGNOSIS — Z79899 Other long term (current) drug therapy: Secondary | ICD-10-CM | POA: Diagnosis not present

## 2023-12-13 DIAGNOSIS — Z8781 Personal history of (healed) traumatic fracture: Secondary | ICD-10-CM | POA: Diagnosis not present

## 2023-12-13 DIAGNOSIS — Z556 Problems related to health literacy: Secondary | ICD-10-CM | POA: Diagnosis not present

## 2023-12-13 DIAGNOSIS — R0989 Other specified symptoms and signs involving the circulatory and respiratory systems: Secondary | ICD-10-CM | POA: Diagnosis not present

## 2023-12-13 DIAGNOSIS — F03B18 Unspecified dementia, moderate, with other behavioral disturbance: Secondary | ICD-10-CM | POA: Diagnosis not present

## 2023-12-15 DIAGNOSIS — F5101 Primary insomnia: Secondary | ICD-10-CM | POA: Diagnosis not present

## 2023-12-15 DIAGNOSIS — R0989 Other specified symptoms and signs involving the circulatory and respiratory systems: Secondary | ICD-10-CM | POA: Diagnosis not present

## 2023-12-15 DIAGNOSIS — F03B18 Unspecified dementia, moderate, with other behavioral disturbance: Secondary | ICD-10-CM | POA: Diagnosis not present

## 2023-12-15 DIAGNOSIS — Z79899 Other long term (current) drug therapy: Secondary | ICD-10-CM | POA: Diagnosis not present

## 2023-12-15 DIAGNOSIS — Z556 Problems related to health literacy: Secondary | ICD-10-CM | POA: Diagnosis not present

## 2023-12-15 DIAGNOSIS — Z87891 Personal history of nicotine dependence: Secondary | ICD-10-CM | POA: Diagnosis not present

## 2023-12-15 DIAGNOSIS — Z8781 Personal history of (healed) traumatic fracture: Secondary | ICD-10-CM | POA: Diagnosis not present

## 2023-12-15 DIAGNOSIS — E7800 Pure hypercholesterolemia, unspecified: Secondary | ICD-10-CM | POA: Diagnosis not present

## 2023-12-18 DIAGNOSIS — Z993 Dependence on wheelchair: Secondary | ICD-10-CM | POA: Diagnosis not present

## 2023-12-18 DIAGNOSIS — D329 Benign neoplasm of meninges, unspecified: Secondary | ICD-10-CM | POA: Diagnosis not present

## 2023-12-18 DIAGNOSIS — N1831 Chronic kidney disease, stage 3a: Secondary | ICD-10-CM | POA: Diagnosis not present

## 2023-12-18 DIAGNOSIS — F0390 Unspecified dementia without behavioral disturbance: Secondary | ICD-10-CM | POA: Diagnosis not present

## 2023-12-18 DIAGNOSIS — Z741 Need for assistance with personal care: Secondary | ICD-10-CM | POA: Diagnosis not present

## 2023-12-26 DIAGNOSIS — Z87891 Personal history of nicotine dependence: Secondary | ICD-10-CM | POA: Diagnosis not present

## 2023-12-26 DIAGNOSIS — F03B18 Unspecified dementia, moderate, with other behavioral disturbance: Secondary | ICD-10-CM | POA: Diagnosis not present

## 2023-12-26 DIAGNOSIS — Z8781 Personal history of (healed) traumatic fracture: Secondary | ICD-10-CM | POA: Diagnosis not present

## 2023-12-26 DIAGNOSIS — E7800 Pure hypercholesterolemia, unspecified: Secondary | ICD-10-CM | POA: Diagnosis not present

## 2023-12-26 DIAGNOSIS — Z79899 Other long term (current) drug therapy: Secondary | ICD-10-CM | POA: Diagnosis not present

## 2023-12-26 DIAGNOSIS — F5101 Primary insomnia: Secondary | ICD-10-CM | POA: Diagnosis not present

## 2023-12-26 DIAGNOSIS — Z556 Problems related to health literacy: Secondary | ICD-10-CM | POA: Diagnosis not present

## 2023-12-26 DIAGNOSIS — R0989 Other specified symptoms and signs involving the circulatory and respiratory systems: Secondary | ICD-10-CM | POA: Diagnosis not present

## 2023-12-30 DIAGNOSIS — E7800 Pure hypercholesterolemia, unspecified: Secondary | ICD-10-CM | POA: Diagnosis not present

## 2023-12-30 DIAGNOSIS — Z87891 Personal history of nicotine dependence: Secondary | ICD-10-CM | POA: Diagnosis not present

## 2023-12-30 DIAGNOSIS — Z556 Problems related to health literacy: Secondary | ICD-10-CM | POA: Diagnosis not present

## 2023-12-30 DIAGNOSIS — F03B18 Unspecified dementia, moderate, with other behavioral disturbance: Secondary | ICD-10-CM | POA: Diagnosis not present

## 2023-12-30 DIAGNOSIS — F5101 Primary insomnia: Secondary | ICD-10-CM | POA: Diagnosis not present

## 2023-12-30 DIAGNOSIS — Z79899 Other long term (current) drug therapy: Secondary | ICD-10-CM | POA: Diagnosis not present

## 2023-12-30 DIAGNOSIS — Z8781 Personal history of (healed) traumatic fracture: Secondary | ICD-10-CM | POA: Diagnosis not present

## 2023-12-30 DIAGNOSIS — R0989 Other specified symptoms and signs involving the circulatory and respiratory systems: Secondary | ICD-10-CM | POA: Diagnosis not present

## 2024-01-02 DIAGNOSIS — Z556 Problems related to health literacy: Secondary | ICD-10-CM | POA: Diagnosis not present

## 2024-01-02 DIAGNOSIS — E7800 Pure hypercholesterolemia, unspecified: Secondary | ICD-10-CM | POA: Diagnosis not present

## 2024-01-02 DIAGNOSIS — F5101 Primary insomnia: Secondary | ICD-10-CM | POA: Diagnosis not present

## 2024-01-02 DIAGNOSIS — F03B18 Unspecified dementia, moderate, with other behavioral disturbance: Secondary | ICD-10-CM | POA: Diagnosis not present

## 2024-01-02 DIAGNOSIS — Z87891 Personal history of nicotine dependence: Secondary | ICD-10-CM | POA: Diagnosis not present

## 2024-01-02 DIAGNOSIS — Z8781 Personal history of (healed) traumatic fracture: Secondary | ICD-10-CM | POA: Diagnosis not present

## 2024-01-02 DIAGNOSIS — R0989 Other specified symptoms and signs involving the circulatory and respiratory systems: Secondary | ICD-10-CM | POA: Diagnosis not present

## 2024-01-02 DIAGNOSIS — Z79899 Other long term (current) drug therapy: Secondary | ICD-10-CM | POA: Diagnosis not present

## 2024-01-06 DIAGNOSIS — Z8781 Personal history of (healed) traumatic fracture: Secondary | ICD-10-CM | POA: Diagnosis not present

## 2024-01-06 DIAGNOSIS — Z87891 Personal history of nicotine dependence: Secondary | ICD-10-CM | POA: Diagnosis not present

## 2024-01-06 DIAGNOSIS — E7800 Pure hypercholesterolemia, unspecified: Secondary | ICD-10-CM | POA: Diagnosis not present

## 2024-01-06 DIAGNOSIS — Z79899 Other long term (current) drug therapy: Secondary | ICD-10-CM | POA: Diagnosis not present

## 2024-01-06 DIAGNOSIS — R0989 Other specified symptoms and signs involving the circulatory and respiratory systems: Secondary | ICD-10-CM | POA: Diagnosis not present

## 2024-01-06 DIAGNOSIS — F03B18 Unspecified dementia, moderate, with other behavioral disturbance: Secondary | ICD-10-CM | POA: Diagnosis not present

## 2024-01-06 DIAGNOSIS — Z556 Problems related to health literacy: Secondary | ICD-10-CM | POA: Diagnosis not present

## 2024-01-06 DIAGNOSIS — F5101 Primary insomnia: Secondary | ICD-10-CM | POA: Diagnosis not present

## 2024-01-08 DIAGNOSIS — F0390 Unspecified dementia without behavioral disturbance: Secondary | ICD-10-CM | POA: Diagnosis not present

## 2024-01-08 DIAGNOSIS — R296 Repeated falls: Secondary | ICD-10-CM | POA: Diagnosis not present

## 2024-02-06 DIAGNOSIS — F0390 Unspecified dementia without behavioral disturbance: Secondary | ICD-10-CM | POA: Diagnosis not present

## 2024-02-06 DIAGNOSIS — R296 Repeated falls: Secondary | ICD-10-CM | POA: Diagnosis not present

## 2024-03-07 DIAGNOSIS — R296 Repeated falls: Secondary | ICD-10-CM | POA: Diagnosis not present

## 2024-03-07 DIAGNOSIS — F0390 Unspecified dementia without behavioral disturbance: Secondary | ICD-10-CM | POA: Diagnosis not present

## 2024-04-07 DIAGNOSIS — R296 Repeated falls: Secondary | ICD-10-CM | POA: Diagnosis not present

## 2024-04-07 DIAGNOSIS — F0390 Unspecified dementia without behavioral disturbance: Secondary | ICD-10-CM | POA: Diagnosis not present

## 2024-05-07 DIAGNOSIS — F0390 Unspecified dementia without behavioral disturbance: Secondary | ICD-10-CM | POA: Diagnosis not present

## 2024-05-07 DIAGNOSIS — R296 Repeated falls: Secondary | ICD-10-CM | POA: Diagnosis not present

## 2024-05-21 ENCOUNTER — Telehealth: Payer: Self-pay | Admitting: Pharmacist

## 2024-05-21 NOTE — Progress Notes (Signed)
   05/21/2024  Patient ID: Whitney Ray, female   DOB: Mar 08, 1941, 83 y.o.   MRN: [id number]  Patient appeared on insurance report for at-risk for failing 2025 metric: Medication Adherence for Cholesterol (MAC)   Outreach to the patient was Successful.   Medication: Pravastatin  20mg  Last fill date: 12/17/23 90DS  Patient has not been taking medications for several months now. Spoke with PCP and she approved discontinuation of all medications except the Quetiapine  as needed for agitation/sleep.   Son said he thought they agreed upon that change months ago, which is why she hasn't been taking them. Only using OTC's provided by full-time caregiver as-needed for acute concerns.   Due to statin discontinuation, patient will FAIL the adherence metric.     Delvin File, PharmD [facility]  Phone Number: [phone]

## 2024-05-26 DIAGNOSIS — K5909 Other constipation: Secondary | ICD-10-CM | POA: Diagnosis not present

## 2024-05-26 DIAGNOSIS — F02818 Dementia in other diseases classified elsewhere, unspecified severity, with other behavioral disturbance: Secondary | ICD-10-CM | POA: Diagnosis not present

## 2024-05-26 DIAGNOSIS — G309 Alzheimer's disease, unspecified: Secondary | ICD-10-CM | POA: Diagnosis not present

## 2024-05-26 DIAGNOSIS — R296 Repeated falls: Secondary | ICD-10-CM | POA: Diagnosis not present

## 2024-06-07 DIAGNOSIS — F0390 Unspecified dementia without behavioral disturbance: Secondary | ICD-10-CM | POA: Diagnosis not present

## 2024-06-07 DIAGNOSIS — R296 Repeated falls: Secondary | ICD-10-CM | POA: Diagnosis not present

## 2024-06-24 DIAGNOSIS — Z7689 Persons encountering health services in other specified circumstances: Secondary | ICD-10-CM | POA: Diagnosis not present

## 2024-07-12 DIAGNOSIS — L210 Seborrhea capitis: Secondary | ICD-10-CM | POA: Diagnosis not present

## 2024-07-12 DIAGNOSIS — E7800 Pure hypercholesterolemia, unspecified: Secondary | ICD-10-CM | POA: Diagnosis not present

## 2024-07-12 DIAGNOSIS — F02818 Dementia in other diseases classified elsewhere, unspecified severity, with other behavioral disturbance: Secondary | ICD-10-CM | POA: Diagnosis not present

## 2024-07-12 DIAGNOSIS — G309 Alzheimer's disease, unspecified: Secondary | ICD-10-CM | POA: Diagnosis not present

## 2024-08-07 DIAGNOSIS — F0390 Unspecified dementia without behavioral disturbance: Secondary | ICD-10-CM | POA: Diagnosis not present

## 2024-08-07 DIAGNOSIS — R296 Repeated falls: Secondary | ICD-10-CM | POA: Diagnosis not present

## 2024-09-07 DIAGNOSIS — F0390 Unspecified dementia without behavioral disturbance: Secondary | ICD-10-CM | POA: Diagnosis not present

## 2024-09-07 DIAGNOSIS — R296 Repeated falls: Secondary | ICD-10-CM | POA: Diagnosis not present

## 2024-10-07 DIAGNOSIS — R296 Repeated falls: Secondary | ICD-10-CM | POA: Diagnosis not present

## 2024-10-07 DIAGNOSIS — F0390 Unspecified dementia without behavioral disturbance: Secondary | ICD-10-CM | POA: Diagnosis not present

## 2024-10-21 DIAGNOSIS — Z0000 Encounter for general adult medical examination without abnormal findings: Secondary | ICD-10-CM | POA: Diagnosis not present

## 2024-10-21 DIAGNOSIS — N182 Chronic kidney disease, stage 2 (mild): Secondary | ICD-10-CM | POA: Diagnosis not present

## 2024-10-21 DIAGNOSIS — I129 Hypertensive chronic kidney disease with stage 1 through stage 4 chronic kidney disease, or unspecified chronic kidney disease: Secondary | ICD-10-CM | POA: Diagnosis not present

## 2024-11-05 ENCOUNTER — Emergency Department (HOSPITAL_COMMUNITY)
Admission: EM | Admit: 2024-11-05 | Discharge: 2024-11-05 | Disposition: A | Discharge status: Home / Self Care | Attending: Emergency Medicine | Admitting: Emergency Medicine

## 2024-11-05 ENCOUNTER — Other Ambulatory Visit: Payer: Self-pay

## 2024-11-05 ENCOUNTER — Emergency Department (HOSPITAL_COMMUNITY)

## 2024-11-05 ENCOUNTER — Encounter (HOSPITAL_COMMUNITY): Payer: Self-pay

## 2024-11-05 DIAGNOSIS — K449 Diaphragmatic hernia without obstruction or gangrene: Secondary | ICD-10-CM | POA: Diagnosis not present

## 2024-11-05 DIAGNOSIS — K5900 Constipation, unspecified: Secondary | ICD-10-CM | POA: Diagnosis not present

## 2024-11-05 DIAGNOSIS — R0989 Other specified symptoms and signs involving the circulatory and respiratory systems: Secondary | ICD-10-CM | POA: Diagnosis not present

## 2024-11-05 DIAGNOSIS — Z7401 Bed confinement status: Secondary | ICD-10-CM | POA: Diagnosis not present

## 2024-11-05 DIAGNOSIS — R109 Unspecified abdominal pain: Secondary | ICD-10-CM | POA: Diagnosis not present

## 2024-11-05 DIAGNOSIS — I10 Essential (primary) hypertension: Secondary | ICD-10-CM | POA: Diagnosis not present

## 2024-11-05 DIAGNOSIS — F0390 Unspecified dementia without behavioral disturbance: Secondary | ICD-10-CM | POA: Diagnosis not present

## 2024-11-05 DIAGNOSIS — R112 Nausea with vomiting, unspecified: Secondary | ICD-10-CM | POA: Diagnosis present

## 2024-11-05 DIAGNOSIS — D259 Leiomyoma of uterus, unspecified: Secondary | ICD-10-CM | POA: Diagnosis not present

## 2024-11-05 DIAGNOSIS — R1111 Vomiting without nausea: Secondary | ICD-10-CM | POA: Diagnosis not present

## 2024-11-05 LAB — COMPREHENSIVE METABOLIC PANEL WITH GFR
ALT: 53 U/L — ABNORMAL HIGH (ref 0–44)
AST: 35 U/L (ref 15–41)
Albumin: 4.1 g/dL (ref 3.5–5.0)
Alkaline Phosphatase: 76 U/L (ref 38–126)
Anion gap: 12 (ref 5–15)
BUN: 18 mg/dL (ref 8–23)
CO2: 24 mmol/L (ref 22–32)
Calcium: 8.9 mg/dL (ref 8.9–10.3)
Chloride: 104 mmol/L (ref 98–111)
Creatinine, Ser: 0.86 mg/dL (ref 0.44–1.00)
GFR, Estimated: >60 mL/min (ref >60)
Glucose, Bld: 177 mg/dL — ABNORMAL HIGH (ref 70–99)
Potassium: 4.4 mmol/L (ref 3.5–5.1)
Sodium: 140 mmol/L (ref 135–145)
Total Bilirubin: 0.5 mg/dL (ref 0.0–1.2)
Total Protein: 7.1 g/dL (ref 6.5–8.1)

## 2024-11-05 LAB — CBC WITH DIFFERENTIAL/PLATELET
Abs Immature Granulocytes: 0.05 K/uL (ref 0.00–0.07)
Basophils Absolute: 0 K/uL (ref 0.0–0.1)
Basophils Relative: 0 %
Eosinophils Absolute: 0 K/uL (ref 0.0–0.5)
Eosinophils Relative: 0 %
HCT: 45 % (ref 36.0–46.0)
Hemoglobin: 14.6 g/dL (ref 12.0–15.0)
Immature Granulocytes: 1 %
Lymphocytes Relative: 7 %
Lymphs Abs: 0.8 K/uL (ref 0.7–4.0)
MCH: 30.2 pg (ref 26.0–34.0)
MCHC: 32.4 g/dL (ref 30.0–36.0)
MCV: 93 fL (ref 80.0–100.0)
Monocytes Absolute: 0.2 K/uL (ref 0.1–1.0)
Monocytes Relative: 2 %
Neutro Abs: 9.6 K/uL — ABNORMAL HIGH (ref 1.7–7.7)
Neutrophils Relative %: 90 %
Platelets: 172 K/uL (ref 150–400)
RBC: 4.84 MIL/uL (ref 3.87–5.11)
RDW: 13.6 % (ref 11.5–15.5)
WBC: 10.6 K/uL — ABNORMAL HIGH (ref 4.0–10.5)
nRBC: 0 % (ref 0.0–0.2)

## 2024-11-05 LAB — URINALYSIS, ROUTINE W REFLEX MICROSCOPIC
Bilirubin Urine: NEGATIVE
Glucose, UA: 150 mg/dL — AB
Hgb urine dipstick: NEGATIVE
Ketones, ur: 5 mg/dL — AB
Nitrite: NEGATIVE
Protein, ur: NEGATIVE mg/dL
Specific Gravity, Urine: 1.01 (ref 1.005–1.030)
pH: 8 (ref 5.0–8.0)

## 2024-11-05 LAB — LIPASE, BLOOD: Lipase: 13 U/L (ref 11–51)

## 2024-11-05 LAB — TROPONIN T, HIGH SENSITIVITY
Troponin T High Sensitivity: <15 ng/L (ref 0–19)
Troponin T High Sensitivity: <15 ng/L (ref 0–19)

## 2024-11-05 MED ORDER — IOHEXOL 300 MG/ML  SOLN
100.0000 mL | Freq: Once | INTRAMUSCULAR | Status: AC | PRN
  Administered 2024-11-05: 85 mL via INTRAVENOUS

## 2024-11-05 MED ORDER — ONDANSETRON HCL 4 MG/2ML IJ SOLN
4.0000 mg | Freq: Once | INTRAMUSCULAR | Status: AC
  Administered 2024-11-05: 4 mg via INTRAVENOUS
  Filled 2024-11-05: qty 2

## 2024-11-05 MED ORDER — PANTOPRAZOLE SODIUM 20 MG PO TBEC: 20.0000 mg | DELAYED_RELEASE_TABLET | Freq: Every day | ORAL | 0 refills | Status: AC

## 2024-11-05 MED ORDER — PANTOPRAZOLE SODIUM 40 MG IV SOLR
40.0000 mg | Freq: Once | INTRAVENOUS | Status: AC
  Administered 2024-11-05: 40 mg via INTRAVENOUS
  Filled 2024-11-05: qty 10

## 2024-11-05 MED ORDER — ONDANSETRON 4 MG PO TBDP
4.0000 mg | ORAL_TABLET | Freq: Three times a day (TID) | ORAL | 0 refills | Status: AC | PRN
Start: 2024-11-05 — End: ?

## 2024-11-05 MED ORDER — SODIUM CHLORIDE 0.9 % IV BOLUS
1000.0000 mL | Freq: Once | INTRAVENOUS | Status: AC
  Administered 2024-11-05: 1000 mL via INTRAVENOUS

## 2024-11-05 NOTE — Discharge Instructions (Addendum)
 You were seen in the emergency department for vomiting and constipation.  Your lab work showed a mildly elevated glucose.  Your CAT scan showed a hiatal hernia and some thickening of the esophagus which may represent some acid reflux.  There was mild amount of stool and no evidence of obstruction.  Prescriptions for acid medication and nausea medication were sent to the pharmacy.  Try to increase fluid intake and continue MiraLAX  for the constipation.  Follow-up with your regular doctor.

## 2024-11-05 NOTE — ED Provider Notes (Signed)
 [facility] EMERGENCY DEPARTMENT AT [facility] Provider Note   CSN: [id number] Arrival date & time: 11/05/24  1637     Patient presents with: No chief complaint on file.   Whitney Ray is a 83 y.o. female.  She is brought in by ambulance from home.  Level 5 caveat secondary to nonverbal dementia.  Per EMS she has had multiple episodes of vomiting today.  Also reported no bowel movement in 5 days.  No family or caregivers at bedside yet.  Patient unable to give any history.  {Add pertinent medical, surgical, social history, OB history to YEP:67052} The history is provided by the EMS personnel and the patient.  Emesis Severity:  Moderate Duration:  1 day Progression:  Unchanged      Prior to Admission medications   Medication Sig Start Date End Date Taking? Authorizing Provider  docusate sodium  (COLACE) 100 MG capsule Take 1 capsule (100 mg total) by mouth every 12 (twelve) hours. 01/20/20   Meryle Ip A, PA-C  Melatonin 5 MG TABS Take 10 mg by mouth at bedtime.    [provider]  polyethylene glycol (MIRALAX ) 17 g packet Take 17 g by mouth daily. 01/20/20   Fawze, Mina A, PA-C  QUEtiapine  (SEROQUEL ) 25 MG tablet Take 50 mg by mouth at bedtime as needed (for mood). 02/03/19   [provider]  SUPER B COMPLEX/C PO Take 1 tablet by mouth daily.    [provider]    Allergies: Penicillins    Review of Systems  Unable to perform ROS: Dementia  Gastrointestinal:  Positive for vomiting.    Updated Vital Signs There were no vitals taken for this visit.  Physical Exam Vitals and nursing note reviewed.  Constitutional:      General: She is not in acute distress.    Appearance: Normal appearance. She is well-developed.  HENT:     Head: Normocephalic and atraumatic.  Eyes:     Conjunctiva/sclera: Conjunctivae normal.  Cardiovascular:     Rate and Rhythm: Normal rate and regular rhythm.     Heart sounds: No murmur heard. Pulmonary:      Effort: Pulmonary effort is normal. No respiratory distress.     Breath sounds: Normal breath sounds.  Abdominal:     Palpations: Abdomen is soft.     Tenderness: There is abdominal tenderness (lower abd). There is no guarding or rebound.     Hernia: No hernia is present.  Musculoskeletal:        General: No deformity.     Cervical back: Neck supple.  Skin:    General: Skin is warm and dry.     Capillary Refill: Capillary refill takes less than 2 seconds.  Neurological:     Mental Status: She is alert.     Comments: Patient is awake.  She is not following commands.     (all labs ordered are listed, but only abnormal results are displayed) Labs Reviewed - No data to display  EKG: None  Radiology: No results found.  {Document cardiac monitor, telemetry assessment procedure when appropriate:32947} Procedures   Medications Ordered in the ED  sodium chloride  0.9 % bolus 1,000 mL (has no administration in time range)  ondansetron (ZOFRAN) injection 4 mg (has no administration in time range)      {Click here for ABCD2, HEART and other calculators REFRESH Note before signing:1}  Medical Decision Making Amount and/or Complexity of Data Reviewed Labs: ordered.  Risk Prescription drug management.   This patient complains of ***; this involves an extensive number of treatment Options and is a complaint that carries with it a high risk of complications and morbidity. The differential includes ***  I ordered, reviewed and interpreted labs, which included *** I ordered medication *** and reviewed PMP when indicated. I ordered imaging studies which included *** and I independently    visualized and interpreted imaging which showed *** Additional history obtained from *** Previous records obtained and reviewed *** I consulted *** and discussed lab and imaging findings and discussed disposition.  Cardiac monitoring reviewed, *** Social determinants  considered, *** Critical Interventions: ***  After the interventions stated above, I reevaluated the patient and found *** Admission and further testing considered, ***   {Document critical care time when appropriate  Document review of labs and clinical decision tools ie CHADS2VASC2, etc  Document your independent review of radiology images and any outside records  Document your discussion with family members, caretakers and with consultants  Document social determinants of health affecting pt's care  Document your decision making why or why not admission, treatments were needed:32947:::1}   Final diagnoses:  None    ED Discharge Orders     None

## 2024-11-05 NOTE — ED Notes (Signed)
 [facility] notified for transport

## 2024-11-05 NOTE — ED Triage Notes (Addendum)
 Pt BIBA from home, c/o FTT, N/V x 1 day and constipation x 5 days.  Nonverbal at baseline, bed bound and dementia.   BP 200/100 CBG 189

## 2024-11-05 NOTE — ED Notes (Signed)
 Unsuccessful IV attempt x2.

## 2024-11-07 DIAGNOSIS — R112 Nausea with vomiting, unspecified: Secondary | ICD-10-CM | POA: Diagnosis not present

## 2024-11-07 DIAGNOSIS — R296 Repeated falls: Secondary | ICD-10-CM | POA: Diagnosis not present
